# Patient Record
Sex: Male | Born: 1995 | Race: White | Hispanic: No | Marital: Married | Smoking: Never smoker
Health system: Southern US, Community
[De-identification: ages and names within clinical notes are randomized; demographics above are authoritative.]

## PROBLEM LIST (undated history)

## (undated) DIAGNOSIS — L02419 Cutaneous abscess of limb, unspecified: Secondary | ICD-10-CM

## (undated) DIAGNOSIS — E669 Obesity, unspecified: Secondary | ICD-10-CM

## (undated) HISTORY — DX: Cutaneous abscess of limb, unspecified: L02.419

## (undated) HISTORY — DX: Obesity, unspecified: E66.9

## (undated) HISTORY — PX: NO PAST SURGERIES: SHX2092

## (undated) HISTORY — PX: WISDOM TOOTH EXTRACTION: SHX21

---

## 2017-02-03 ENCOUNTER — Ambulatory Visit: Payer: Self-pay | Admitting: Physician Assistant

## 2017-02-18 ENCOUNTER — Encounter: Payer: Self-pay | Admitting: Physician Assistant

## 2017-02-18 ENCOUNTER — Encounter (INDEPENDENT_AMBULATORY_CARE_PROVIDER_SITE_OTHER): Payer: Self-pay

## 2017-02-18 ENCOUNTER — Ambulatory Visit (INDEPENDENT_AMBULATORY_CARE_PROVIDER_SITE_OTHER): Payer: Self-pay | Admitting: Physician Assistant

## 2017-02-18 VITALS — BP 135/82 | HR 96 | Temp 98.1°F | Ht 74.0 in | Wt 273.3 lb

## 2017-02-18 DIAGNOSIS — R1084 Generalized abdominal pain: Secondary | ICD-10-CM

## 2017-02-18 DIAGNOSIS — Z7689 Persons encountering health services in other specified circumstances: Secondary | ICD-10-CM

## 2017-02-18 DIAGNOSIS — R1013 Epigastric pain: Secondary | ICD-10-CM

## 2017-02-18 DIAGNOSIS — F32A Depression, unspecified: Secondary | ICD-10-CM

## 2017-02-18 DIAGNOSIS — E6609 Other obesity due to excess calories: Secondary | ICD-10-CM

## 2017-02-18 DIAGNOSIS — F329 Major depressive disorder, single episode, unspecified: Secondary | ICD-10-CM

## 2017-02-18 DIAGNOSIS — Z131 Encounter for screening for diabetes mellitus: Secondary | ICD-10-CM

## 2017-02-18 DIAGNOSIS — Z6835 Body mass index (BMI) 35.0-35.9, adult: Secondary | ICD-10-CM | POA: Insufficient documentation

## 2017-02-18 DIAGNOSIS — R0982 Postnasal drip: Secondary | ICD-10-CM

## 2017-02-18 MED ORDER — OMEPRAZOLE 20 MG PO CPDR: DELAYED_RELEASE_CAPSULE | ORAL | 11 refills | Status: DC

## 2017-02-18 MED ORDER — FLUTICASONE PROPIONATE 50 MCG/ACT NA SUSP: 2.0000 | Freq: Every day | NASAL | 6 refills | Status: DC

## 2017-02-18 MED ORDER — CETIRIZINE HCL 10 MG PO TABS: 10.0000 mg | ORAL_TABLET | Freq: Every day | ORAL | 11 refills | Status: DC

## 2017-02-18 NOTE — Patient Instructions (Addendum)
For mucus/post-nasal drip - Cetirizine 10 mg nightly (antihistamine) - Flonase 2 sprays in each nostril nightly (intranasal steroid)  For reflux: - GERD diet - 4 week trial of Omeprazole    Food Choices for Gastroesophageal Reflux Disease, Adult When you have gastroesophageal reflux disease (GERD), the foods you eat and your eating habits are very important. Choosing the right foods can help ease the discomfort of GERD. Consider working with a diet and nutrition specialist (dietitian) to help you make healthy food choices. What general guidelines should I follow? Eating plan  Choose healthy foods low in fat, such as fruits, vegetables, whole grains, low-fat dairy products, and lean meat, fish, and poultry.  Eat frequent, small meals instead of three large meals each day. Eat your meals slowly, in a relaxed setting. Avoid bending over or lying down until 2-3 hours after eating.  Limit high-fat foods such as fatty meats or fried foods.  Limit your intake of oils, butter, and shortening to less than 8 teaspoons each day.  Avoid the following: ? Foods that cause symptoms. These may be different for different people. Keep a food diary to keep track of foods that cause symptoms. ? Alcohol. ? Drinking large amounts of liquid with meals. ? Eating meals during the 2-3 hours before bed.  Cook foods using methods other than frying. This may include baking, grilling, or broiling. Lifestyle   Maintain a healthy weight. Ask your health care provider what weight is healthy for you. If you need to lose weight, work with your health care provider to do so safely.  Exercise for at least 30 minutes on 5 or more days each week, or as told by your health care provider.  Avoid wearing clothes that fit tightly around your waist and chest.  Do not use any products that contain nicotine or tobacco, such as cigarettes and e-cigarettes. If you need help quitting, ask your health care provider.  Sleep  with the head of your bed raised. Use a wedge under the mattress or blocks under the bed frame to raise the head of the bed. What foods are not recommended? The items listed may not be a complete list. Talk with your dietitian about what dietary choices are best for you. Grains Pastries or quick breads with added fat. [place] toast. Vegetables Deep fried vegetables. [place] fries. Any vegetables prepared with added fat. Any vegetables that cause symptoms. For some people this may include tomatoes and tomato products, chili peppers, onions and garlic, and horseradish. Fruits Any fruits prepared with added fat. Any fruits that cause symptoms. For some people this may include citrus fruits, such as oranges, grapefruit, pineapple, and lemons. Meats and other protein foods High-fat meats, such as fatty beef or pork, hot dogs, ribs, ham, sausage, salami and bacon. Fried meat or protein, including fried fish and fried chicken. Nuts and nut butters. Dairy Whole milk and chocolate milk. Sour cream. Cream. Ice cream. Cream cheese. Milk shakes. Beverages Coffee and tea, with or without caffeine. Carbonated beverages. Sodas. Energy drinks. Fruit juice made with acidic fruits (such as orange or grapefruit). Tomato juice. Alcoholic drinks. Fats and oils Butter. Margarine. Shortening. Ghee. Sweets and desserts Chocolate and cocoa. Donuts. Seasoning and other foods Pepper. Peppermint and spearmint. Any condiments, herbs, or seasonings that cause symptoms. For some people, this may include curry, hot sauce, or vinegar-based salad dressings. Summary  When you have gastroesophageal reflux disease (GERD), food and lifestyle choices are very important to help ease the discomfort of GERD.  Eat frequent, small meals instead of three large meals each day. Eat your meals slowly, in a relaxed setting. Avoid bending over or lying down until 2-3 hours after eating.  Limit high-fat foods such as fatty meat or fried  foods. This information is not intended to replace advice given to you by your health care provider. Make sure you discuss any questions you have with your health care provider. Document Released: 03/31/2005 Document Revised: 04/01/2016 Document Reviewed: 04/01/2016 Elsevier Interactive Patient Education  2017 [employer]

## 2017-02-18 NOTE — Progress Notes (Signed)
HPI:                                                                Joel Ferguson is a 21 y.o. male who [facility] today to establish care  Current concerns include: abdominal pain, post-nasal drip  Patient reports 1 year of globus sensation, belching, burning epigastric pain, nausea, regurgitation, and throat clearing. Gradually worsening. Symptoms are worse with spicy foods. Denies fever, chills, weight loss, dysphagia, forceful vomiting, hematochezia, melena, change in bowel habits. Has tried Tums with minimal relief. Denies NSAID use.  Patient also reports mucus in his throat, worse in the mornings. He is able to cough it up, but it is bothersome to him. No sore throat, dysphagia, odynophagia. No rhinorrhea, congestion, sinus pressure/pain, headaches.  Past Medical History:  Diagnosis Date  . Abscess of knee    Past Surgical History:  Procedure Laterality Date  . NO PAST SURGERIES     Social History   Tobacco Use  . Smoking status: Never Smoker  . Smokeless tobacco: Never Used  Substance Use Topics  . Alcohol use: Yes    Comment: OCCASSION'S   family history includes Colon polyps in his maternal grandmother.  ROS: Review of Systems  Constitutional: Negative.   HENT: Positive for sore throat.   Eyes: Negative.   Cardiovascular: Positive for chest pain.  Gastrointestinal: Positive for abdominal pain, constipation, diarrhea, heartburn and nausea.  Musculoskeletal: Positive for back pain.  Skin: Negative.   Psychiatric/Behavioral: Positive for depression. The patient is nervous/anxious.     Medications: Current Outpatient Medications  Medication Sig Dispense Refill  . cetirizine (ZYRTEC) 10 MG tablet Take 1 tablet (10 mg total) at bedtime by mouth. 30 tablet 11  . fluticasone (FLONASE) 50 MCG/ACT nasal spray Place 2 sprays at bedtime into both nostrils. 16 g 6  . omeprazole (PRILOSEC) 20 MG capsule Daily x 4  weeks 30 capsule 11   No current facility-administered medications for this visit.    Allergies  Allergen Reactions  . Nickel Itching       Objective:  BP 135/82   Pulse 96   Temp 98.1 F (36.7 C)   Ht 6\' 2"  (1.88 m)   Wt 273 lb 5 oz (124 kg)   SpO2 96%   BMI 35.09 kg/m  Gen:  alert, not ill-appearing, no distress, appropriate for age, obese male HEENT: head normocephalic without obvious abnormality, conjunctiva and cornea clear, oropharynx clear, no exudates, uvula midline, neck supple, no adenopathy, trachea midline Pulm: Normal work of breathing, normal phonation, clear to auscultation bilaterally, no wheezes, rales or rhonchi CV: Normal rate, regular rhythm, s1 and s2 distinct, no murmurs, clicks or rubs  GI: abdomen soft, nontender, non-distended, exam limited by body habitus Neuro: alert and oriented x 3, no tremor MSK: extremities atraumatic, normal gait and station Skin: intact, no rashes on exposed skin, no jaundice, no cyanosis Psych: well-groomed, cooperative, good eye contact, depressed mood, affect mood-congruent, speech is articulate, and thought processes clear and goal-directed  Depression screen [facility] 02/18/2017  Decreased Interest 2  Down, Depressed, Hopeless 2  PHQ - 2 Score 4  Altered sleeping 1  Tired, decreased energy 1  Change in appetite 2  Feeling bad  or failure about yourself  2  Trouble concentrating 0  Moving slowly or fidgety/restless 0  Suicidal thoughts 0  PHQ-9 Score 10    No flowsheet data found.     No results found.    Assessment and Plan: 21 y.o. male with   1. Encounter to establish care - reviewed PMH, PSH, PFH, medications and allergies - reviewed health maintenance - declines influenza  2. Dyspepsia - H. pylori breath test today. 4 week PPI trial - omeprazole (PRILOSEC) 20 MG capsule; Daily x 4 weeks  Dispense: 30 capsule; Refill: 11   3. Generalized abdominal pain - CBC - Comprehensive metabolic panel -  Hemoglobin A1c - Lipase - H. pylori breath test  4. Screening for diabetes mellitus - Hemoglobin A1c  5. Class 2 obesity due to excess calories without serious comorbidity with body mass index (BMI) of 35.0 to 35.9 in adult - counseled on weight loss through decreasing caloric intake and increasing cardiovascular exercise - Hemoglobin A1c  6. Post-nasal drainage - nightly antihistamine and intranasal corticosteroid - fluticasone (FLONASE) 50 MCG/ACT nasal spray; Place 2 sprays at bedtime into both nostrils.  Dispense: 16 g; Refill: 6 - cetirizine (ZYRTEC) 10 MG tablet; Take 1 tablet (10 mg total) at bedtime by mouth.  Dispense: 30 tablet; Refill: 11  7. Minor depression - PHQ9 score 10, no acute safety issues - there was not adequate time to address this problem in detail at this initial visit due to patient's other concerns. He was encouraged to follow-up in 1-2 weeks.   Patient education and anticipatory guidance given Patient agrees with treatment plan Follow-up as needed if symptoms worsen or fail to improve  Levonne Hubertharley E. Jarrett Chicoine PA-C

## 2017-02-19 ENCOUNTER — Encounter: Payer: Self-pay | Admitting: Physician Assistant

## 2017-02-19 LAB — CBC
HCT: 47.2 % (ref 38.5–50.0)
HEMOGLOBIN: 16.1 g/dL (ref 13.2–17.1)
MCH: 28.5 pg (ref 27.0–33.0)
MCHC: 34.1 g/dL (ref 32.0–36.0)
MCV: 83.5 fL (ref 80.0–100.0)
MPV: 11.9 fL (ref 7.5–12.5)
PLATELETS: 251 10*3/uL (ref 140–400)
RBC: 5.65 10*6/uL (ref 4.20–5.80)
RDW: 13.1 % (ref 11.0–15.0)
WBC: 13.1 10*3/uL — AB (ref 3.8–10.8)

## 2017-02-19 LAB — LIPASE: Lipase: 11 U/L (ref 7–60)

## 2017-02-19 LAB — COMPREHENSIVE METABOLIC PANEL
AG RATIO: 1.6 (calc) (ref 1.0–2.5)
ALT: 25 U/L (ref 9–46)
AST: 18 U/L (ref 10–40)
Albumin: 4.5 g/dL (ref 3.6–5.1)
Alkaline phosphatase (APISO): 72 U/L (ref 40–115)
BUN: 14 mg/dL (ref 7–25)
CHLORIDE: 102 mmol/L (ref 98–110)
CO2: 27 mmol/L (ref 20–32)
CREATININE: 0.96 mg/dL (ref 0.60–1.35)
Calcium: 9.5 mg/dL (ref 8.6–10.3)
GLOBULIN: 2.9 g/dL (ref 1.9–3.7)
GLUCOSE: 84 mg/dL (ref 65–99)
Potassium: 4.3 mmol/L (ref 3.5–5.3)
SODIUM: 138 mmol/L (ref 135–146)
TOTAL PROTEIN: 7.4 g/dL (ref 6.1–8.1)
Total Bilirubin: 0.4 mg/dL (ref 0.2–1.2)

## 2017-02-19 LAB — HEMOGLOBIN A1C
EAG (MMOL/L): 5.7 (calc)
Hgb A1c MFr Bld: 5.2 % of total Hgb (ref <5.7)
Mean Plasma Glucose: 103 (calc)

## 2017-02-19 LAB — H. PYLORI BREATH TEST: H. PYLORI BREATH TEST: NOT DETECTED

## 2017-03-19 ENCOUNTER — Ambulatory Visit: Payer: Self-pay | Admitting: Physician Assistant

## 2017-09-23 ENCOUNTER — Encounter: Payer: Self-pay | Admitting: Physician Assistant

## 2017-09-23 ENCOUNTER — Ambulatory Visit (INDEPENDENT_AMBULATORY_CARE_PROVIDER_SITE_OTHER): Payer: Self-pay | Admitting: Physician Assistant

## 2017-09-23 VITALS — BP 121/83 | HR 86 | Temp 98.1°F | Wt 284.0 lb

## 2017-09-23 DIAGNOSIS — R152 Fecal urgency: Secondary | ICD-10-CM

## 2017-09-23 DIAGNOSIS — R072 Precordial pain: Secondary | ICD-10-CM

## 2017-09-23 DIAGNOSIS — H6992 Unspecified Eustachian tube disorder, left ear: Secondary | ICD-10-CM | POA: Insufficient documentation

## 2017-09-23 DIAGNOSIS — R195 Other fecal abnormalities: Secondary | ICD-10-CM

## 2017-09-23 DIAGNOSIS — K219 Gastro-esophageal reflux disease without esophagitis: Secondary | ICD-10-CM | POA: Insufficient documentation

## 2017-09-23 DIAGNOSIS — Z9109 Other allergy status, other than to drugs and biological substances: Secondary | ICD-10-CM

## 2017-09-23 DIAGNOSIS — H6982 Other specified disorders of Eustachian tube, left ear: Secondary | ICD-10-CM

## 2017-09-23 MED ORDER — SUCRALFATE 1 G PO TABS: 1.0000 g | ORAL_TABLET | Freq: Three times a day (TID) | ORAL | 1 refills | Status: DC

## 2017-09-23 MED ORDER — ESOMEPRAZOLE MAGNESIUM 40 MG PO CPDR: 40.0000 mg | DELAYED_RELEASE_CAPSULE | Freq: Every day | ORAL | 1 refills | Status: DC

## 2017-09-23 NOTE — Progress Notes (Signed)
HPI:                                                                Joel Ferguson is a 22 y.o. male who presents to [facility]: Primary Care Sports Medicine today for multiple concerns  Episodic left-sided chest pain, described as sharp, lasting minutes, described as annoying, occuring 2-3 times per month. Only occurs at rest. In the last month, reports he was a passenger in car and felt a crushing left-sided chest pain described as severe lasting 10 minutes. It was not associated with any palpitations, irregular heart beats, diaphoresis. He has never had exertional chest pain. Reports dyspnea on exertion on a recent hike, but currently does not do any form of regular aerobic activity.   Abdominal Pain  This is a chronic problem. The current episode started more than 1 year ago. The problem has been unchanged. The pain is located in the LUQ and epigastric region. Associated symptoms include diarrhea (loose stools), nausea and vomiting (regurgitation and emesis weekly). Pertinent negatives include no hematochezia or weight loss. Associated symptoms comments: + early satiety + fecal urgency. He has tried proton pump inhibitors for the symptoms. The treatment provided no relief.   Rhinitis: still having nasal congestion, post-nasal drip, and mucus. Reports when he was taking Zyrtec and Flonase it was helpful, but was drying to the back of his throat. He recently developed left ear fullness and occasional popping.  He would like to be tested for allergies.   No flowsheet data found.    Past Medical History:  Diagnosis Date  . Abscess of knee   . Obesity    Past Surgical History:  Procedure Laterality Date  . NO PAST SURGERIES     Social History   Tobacco Use  . Smoking status: Never Smoker  . Smokeless tobacco: Never Used  Substance Use Topics  . Alcohol use: Yes    Comment: OCCASSION'S   family history includes Colon polyps in his maternal grandmother; Edema in  his paternal grandmother; Gallstones in his mother; Pancreatic cancer in his other.    ROS: Review of Systems  Constitutional: Negative for weight loss.  HENT:       + L ear fullness and popping  Cardiovascular: Positive for chest pain.  Gastrointestinal: Positive for abdominal pain, diarrhea (loose stools), nausea and vomiting (regurgitation and emesis weekly). Negative for hematochezia.  Musculoskeletal: Positive for back pain.  Endo/Heme/Allergies: Positive for environmental allergies.    Medications: Current Outpatient Medications  Medication Sig Dispense Refill  . cetirizine (ZYRTEC) 10 MG tablet Take 1 tablet (10 mg total) at bedtime by mouth. 30 tablet 11  . esomeprazole (NEXIUM) 40 MG capsule Take 1 capsule (40 mg total) by mouth daily before breakfast. 30 capsule 1  . sucralfate (CARAFATE) 1 g tablet Take 1 tablet (1 g total) by mouth 4 (four) times daily -  with meals and at bedtime. 120 tablet 1   No current facility-administered medications for this visit.    Allergies  Allergen Reactions  . Nickel Itching       Objective:  BP 121/83   Pulse 86   Temp 98.1 F (36.7 C) (Oral)   Wt 284 lb (128.8 kg)   BMI 36.46 kg/m  Gen:  alert, not ill-appearing, no distress, appropriate for age, obese male  HEENT: head normocephalic without obvious abnormality, conjunctiva and cornea clear, TM's pearly gray and semi-transparent, normal external ear canals bilaterally, trachea midline Pulm: Normal work of breathing, normal phonation, clear to auscultation bilaterally, no wheezes, rales or rhonchi CV: Normal rate, regular rhythm, s1 and s2 distinct, no murmurs, clicks or rubs  GI: abdomen soft, there is epigastric and LUQ tenderness, no guarding or rigidity, no palpable masses Neuro: alert and oriented x 3, no tremor MSK: extremities atraumatic, normal gait and station, no peripheral edema Skin: intact, no rashes on exposed skin, no jaundice, no cyanosis Psych: well-groomed,  cooperative, good eye contact, euthymic mood, affect mood-congruent, speech is articulate, and thought processes clear and goal-directed    No results found for this or any previous visit (from the past 72 hour(s)). No results found.    Assessment and Plan: 22 y.o. male with   Gastroesophageal reflux disease, esophagitis presence not specified - Plan: Ambulatory referral to Gastroenterology, sucralfate (CARAFATE) 1 g tablet, esomeprazole (NEXIUM) 40 MG capsule  Precordial chest pain  Loose stools  Fecal urgency  Environmental allergies - Plan: Ambulatory referral to Allergy  Eustachian tube dysfunction, left  GERD - failed trial of low-dose PPI and dietary modification. Work-up 7 months ago was unremarkable - normal CBC, CMP, lipase and H. Pylori breath test. Symptoms are worsening with early satiety and nausea present most days. Referring to GI for EGD - will switch to high-dose Nexium and Carafate in the meantime - encouraged to eat smaller, frequent meals and avoid fried and overly processed food  Precordial chest pain - non-exertional, localized, sporadic left-sided chest pain. Most likely precordial catch syndrome - patient deferred cardiac work-up today  Loose stools, fecal urgency - likely IBS-D - referring to GI  Eustachian tube dysfunction - no evidence of acute OM on exam - encouraged him to re-start his antihistamine and Flonase. If he is having dryness, he can reduce Flonase to QOD or 2-3 times per week   I spent 40 minutes with this patient, greater than 50% was face-to-face time counseling regarding the above diagnoses   Patient education and anticipatory guidance given Patient agrees with treatment plan Follow-up as needed if symptoms worsen or fail to improve  Levonne Hubertharley E. Cummings PA-C

## 2017-09-23 NOTE — Patient Instructions (Addendum)
Nasal Allergies Nasal allergies are a reaction to allergens in the air. Allergens are particles in the air that cause your body to have an allergic reaction. Nasal allergies are not passed from person to person (are not contagious). They cannot be cured, but they can be controlled. What are the causes? Seasonal nasal allergies (hay fever) are caused by pollen allergens that come from grasses, trees, and weeds. Year-round nasal allergies (perennial allergic rhinitis) are caused by allergens such as house dust mites, pet dander, and mold spores. What increases the risk? The following factors may make you more likely to develop this condition:  Having certain health conditions. These include: ? Other types of allergies, such as food allergies. ? Asthma. ? Eczema.  Having a close relative who has allergies or asthma.  Exposure to house dust, pollen, dander, or other allergens at home or at work.  Exposure to air pollution or secondhand smoke when you were a child.  What are the signs or symptoms? Symptoms of this condition include:  Sneezing.  Runny nose or stuffy nose (congestion).  Watery (tearing) eyes.  Itchy eyes, nose, mouth, throat, skin, or other area.  Sore throat.  Headache.  Decreased sense of smell or taste.  Fatigue. This may occur if you have trouble sleeping due to allergies.  Swollen eyelids.  How is this diagnosed? This condition is diagnosed with a medical history and physical exam. Allergy testing may be done to determine exactly what triggers your nasal allergies. How is this treated? There is no cure for nasal allergies. Treatment focuses on controlling your symptoms, and it may include:  Medicines that block allergy symptoms. These may include allergy shots, nasal sprays, and oral antihistamines.  Avoiding the allergen.  Follow these instructions at home:  Avoid the allergen that is causing your symptoms, if possible.  Keep windows closed. If  possible, use air conditioning when pollen counts are high.  Do not use fans in your home.  Do not hang clothes outside to dry.  Wear sunglasses to keep pollen out of your eyes.  Wash your hands right away after you touch household pets.  Take over-the-counter and prescription medicines only as told by your health care provider.  Keep all follow-up visits as told by your health care provider. This is important. Contact a health care provider if:  You have a fever.  You develop a cough that does not go away (is persistent).  You start to wheeze.  Your symptoms do not improve with treatment.  You have thick nasal discharge.  You start to have nosebleeds. Get help right away if:  Your tongue or your lips are swollen.  You have trouble breathing.  You feel light-headed or you feel like you are going to faint.  You have cold sweats. This information is not intended to replace advice given to you by your health care provider. Make sure you discuss any questions you have with your health care provider. Document Released: 03/31/2005 Document Revised: 09/03/2015 Document Reviewed: 10/11/2014 Elsevier Interactive Patient Education  2018 [employer].   Food Choices for Gastroesophageal Reflux Disease, Adult When you have gastroesophageal reflux disease (GERD), the foods you eat and your eating habits are very important. Choosing the right foods can help ease the discomfort of GERD. Consider working with a diet and nutrition specialist (dietitian) to help you make healthy food choices. What general guidelines should I follow? Eating plan  Choose healthy foods low in fat, such as fruits, vegetables, whole grains, low-fat  dairy products, and lean meat, fish, and poultry.  Eat frequent, small meals instead of three large meals each day. Eat your meals slowly, in a relaxed setting. Avoid bending over or lying down until 2-3 hours after eating.  Limit high-fat foods such as fatty  meats or fried foods.  Limit your intake of oils, butter, and shortening to less than 8 teaspoons each day.  Avoid the following: ? Foods that cause symptoms. These may be different for different people. Keep a food diary to keep track of foods that cause symptoms. ? Alcohol. ? Drinking large amounts of liquid with meals. ? Eating meals during the 2-3 hours before bed.  Cook foods using methods other than frying. This may include baking, grilling, or broiling. Lifestyle   Maintain a healthy weight. Ask your health care provider what weight is healthy for you. If you need to lose weight, work with your health care provider to do so safely.  Exercise for at least 30 minutes on 5 or more days each week, or as told by your health care provider.  Avoid wearing clothes that fit tightly around your waist and chest.  Do not use any products that contain nicotine or tobacco, such as cigarettes and e-cigarettes. If you need help quitting, ask your health care provider.  Sleep with the head of your bed raised. Use a wedge under the mattress or blocks under the bed frame to raise the head of the bed. What foods are not recommended? The items listed may not be a complete list. Talk with your dietitian about what dietary choices are best for you. Grains Pastries or quick breads with added fat. [place] toast. Vegetables Deep fried vegetables. [place] fries. Any vegetables prepared with added fat. Any vegetables that cause symptoms. For some people this may include tomatoes and tomato products, chili peppers, onions and garlic, and horseradish. Fruits Any fruits prepared with added fat. Any fruits that cause symptoms. For some people this may include citrus fruits, such as oranges, grapefruit, pineapple, and lemons. Meats and other protein foods High-fat meats, such as fatty beef or pork, hot dogs, ribs, ham, sausage, salami and bacon. Fried meat or protein, including fried fish and fried chicken. Nuts  and nut butters. Dairy Whole milk and chocolate milk. Sour cream. Cream. Ice cream. Cream cheese. Milk shakes. Beverages Coffee and tea, with or without caffeine. Carbonated beverages. Sodas. Energy drinks. Fruit juice made with acidic fruits (such as orange or grapefruit). Tomato juice. Alcoholic drinks. Fats and oils Butter. Margarine. Shortening. Ghee. Sweets and desserts Chocolate and cocoa. Donuts. Seasoning and other foods Pepper. Peppermint and spearmint. Any condiments, herbs, or seasonings that cause symptoms. For some people, this may include curry, hot sauce, or vinegar-based salad dressings. Summary  When you have gastroesophageal reflux disease (GERD), food and lifestyle choices are very important to help ease the discomfort of GERD.  Eat frequent, small meals instead of three large meals each day. Eat your meals slowly, in a relaxed setting. Avoid bending over or lying down until 2-3 hours after eating.  Limit high-fat foods such as fatty meat or fried foods. This information is not intended to replace advice given to you by your health care provider. Make sure you discuss any questions you have with your health care provider. Document Released: 03/31/2005 Document Revised: 04/01/2016 Document Reviewed: 04/01/2016 Elsevier Interactive Patient Education  [employer]2018 Else[employer] Inc.

## 2017-11-23 ENCOUNTER — Encounter: Payer: Self-pay | Admitting: Physician Assistant

## 2019-11-08 ENCOUNTER — Ambulatory Visit (INDEPENDENT_AMBULATORY_CARE_PROVIDER_SITE_OTHER): Payer: 59 | Admitting: Family Medicine

## 2019-11-08 ENCOUNTER — Encounter: Payer: Self-pay | Admitting: Family Medicine

## 2019-11-08 DIAGNOSIS — J209 Acute bronchitis, unspecified: Secondary | ICD-10-CM | POA: Diagnosis not present

## 2019-11-08 MED ORDER — DOXYCYCLINE HYCLATE 100 MG PO TABS: 100.0000 mg | ORAL_TABLET | Freq: Two times a day (BID) | ORAL | 0 refills | Status: DC

## 2019-11-08 MED ORDER — PREDNISONE 20 MG PO TABS: 20.0000 mg | ORAL_TABLET | Freq: Two times a day (BID) | ORAL | 0 refills | Status: AC

## 2019-11-08 MED ORDER — ALBUTEROL SULFATE HFA 108 (90 BASE) MCG/ACT IN AERS: 2.0000 | INHALATION_SPRAY | Freq: Four times a day (QID) | RESPIRATORY_TRACT | 1 refills | Status: AC | PRN

## 2019-11-08 NOTE — Progress Notes (Signed)
Joel Ferguson - 24 y.o. male MRN [id number]  Date of birth: [birth year]  Subjective Chief Complaint  Patient presents with  . Cough  . Wheezing    HPI Joel Ferguson is a 24 y.o. male here today with complaint of cough with chest congestion and wheezing.  He reports that symptoms started about 4 weeks ago.  Family members have had similar symptoms as well. Other family members tested for COVID were negative.  He was feeling better a couple of weeks ago and then symptoms seem to worsen again.  He denies prior history of asthma.  He has not had fever, chills, chest pain, shortness of breath or GI symptoms.    ROS:  A comprehensive ROS was completed and negative except as noted per HPI  Allergies  Allergen Reactions  . Nickel Itching    Past Medical History:  Diagnosis Date  . Abscess of knee   . Obesity     Past Surgical History:  Procedure Laterality Date  . WISDOM TOOTH EXTRACTION      Social History   Socioeconomic History  . Marital status: Married    Spouse name: Not on file  . Number of children: 1  . Years of education: Not on file  . Highest education level: Not on file  Occupational History  . Not on file  Tobacco Use  . Smoking status: Never Smoker  . Smokeless tobacco: Never Used  Vaping Use  . Vaping Use: Never used  Substance and Sexual Activity  . Alcohol use: Yes    Comment: OCCASSION'S  . Drug use: No  . Sexual activity: Not Currently    Birth control/protection: None  Other Topics Concern  . Not on file  Social History Narrative  . Not on file   Social Determinants of Health   Financial Resource Strain:   . Difficulty of Paying Living Expenses:   Food Insecurity:   . Worried About Programme researcher, broadcasting/film/video in the Last Year:   . Barista in the Last Year:   Transportation Needs:   . Freight forwarder (Medical):   Marland Kitchen Lack of Transportation (Non-Medical):   Physical Activity:   . Days of Exercise per Week:   . Minutes of Exercise per  Session:   Stress:   . Feeling of Stress :   Social Connections:   . Frequency of Communication with Friends and Family:   . Frequency of Social Gatherings with Friends and Family:   . Attends Religious Services:   . Active Member of Clubs or Organizations:   . Attends Banker Meetings:   Marland Kitchen Marital Status:     Family History  Problem Relation Age of Onset  . Gallstones Mother   . Colon polyps Maternal Grandmother   . Edema Paternal Grandmother   . Pancreatic cancer Other     Health Maintenance  Topic Date Due  . Hepatitis C Screening  Never done  . COVID-19 Vaccine (1) Never done  . HIV Screening  Never done  . TETANUS/TDAP  Never done  . INFLUENZA VACCINE  11/13/2019     ----------------------------------------------------------------------------------------------------------------------------------------------------------------------------------------------------------------- Physical Exam BP (!) 135/81 (BP Location: Left Arm, Patient Position: Sitting, Cuff Size: Large)   Pulse 103   Temp 98.5 F (36.9 C) (Oral)   Ht 6' 2.02" (1.88 m)   Wt (!) 286 lb (129.7 kg)   SpO2 99%   BMI 36.70 kg/m   Physical Exam Constitutional:      Appearance: Normal appearance.  HENT:     Head: Normocephalic.  Eyes:     General: No scleral icterus. Cardiovascular:     Rate and Rhythm: Normal rate and regular rhythm.  Pulmonary:     Effort: Pulmonary effort is normal.     Breath sounds: Wheezing (mild bilateral expiratory wheezes) present.  Musculoskeletal:     Cervical back: Neck supple.  Neurological:     General: No focal deficit present.     Mental Status: He is alert.  Psychiatric:        Mood and Affect: Mood normal.        Behavior: Behavior normal.      ------------------------------------------------------------------------------------------------------------------------------------------------------------------------------------------------------------------- Assessment and Plan  Acute bronchitis He has had prolonged symptoms x4 weeks as well as second sickening.  Will start doxycycline 100mg  bid Prednisone burst at 20mg  BID x5 days  Albuterol inhaler as needed.  Instructed on use.  Instructed to follow up if symptoms fail to improve or if he develops new or worseningy symptoms.    Meds ordered this encounter  Medications  . doxycycline (VIBRA-TABS) 100 MG tablet    Sig: Take 1 tablet (100 mg total) by mouth 2 (two) times daily.    Dispense:  20 tablet    Refill:  0  . predniSONE (DELTASONE) 20 MG tablet    Sig: Take 1 tablet (20 mg total) by mouth 2 (two) times daily with a meal for 5 days.    Dispense:  10 tablet    Refill:  0  . albuterol (VENTOLIN HFA) 108 (90 Base) MCG/ACT inhaler    Sig: Inhale 2 puffs into the lungs every 6 (six) hours as needed for wheezing or shortness of breath.    Dispense:  18 g    Refill:  1    No follow-ups on file.    This visit occurred during the SARS-CoV-2 public health emergency.  Safety protocols were in place, including screening questions prior to the visit, additional usage of staff PPE, and extensive cleaning of exam room while observing appropriate contact time as indicated for disinfecting solutions.

## 2019-11-08 NOTE — Assessment & Plan Note (Signed)
He has had prolonged symptoms x4 weeks as well as second sickening.  Will start doxycycline 100mg  bid Prednisone burst at 20mg  BID x5 days  Albuterol inhaler as needed.  Instructed on use.  Instructed to follow up if symptoms fail to improve or if he develops new or worseningy symptoms.

## 2019-11-08 NOTE — Patient Instructions (Signed)

## 2019-11-14 ENCOUNTER — Ambulatory Visit: Payer: Self-pay | Admitting: Family Medicine

## 2019-11-14 ENCOUNTER — Encounter: Payer: 59 | Admitting: Family Medicine

## 2019-11-15 ENCOUNTER — Ambulatory Visit (INDEPENDENT_AMBULATORY_CARE_PROVIDER_SITE_OTHER): Payer: 59

## 2019-11-15 ENCOUNTER — Other Ambulatory Visit: Payer: Self-pay

## 2019-11-15 ENCOUNTER — Encounter: Payer: Self-pay | Admitting: Family Medicine

## 2019-11-15 ENCOUNTER — Ambulatory Visit (INDEPENDENT_AMBULATORY_CARE_PROVIDER_SITE_OTHER): Payer: 59 | Admitting: Family Medicine

## 2019-11-15 VITALS — BP 127/84 | HR 93 | Ht 74.02 in | Wt 286.0 lb

## 2019-11-15 DIAGNOSIS — M549 Dorsalgia, unspecified: Secondary | ICD-10-CM | POA: Diagnosis not present

## 2019-11-15 DIAGNOSIS — Z13220 Encounter for screening for lipoid disorders: Secondary | ICD-10-CM | POA: Diagnosis not present

## 2019-11-15 DIAGNOSIS — R21 Rash and other nonspecific skin eruption: Secondary | ICD-10-CM | POA: Diagnosis not present

## 2019-11-15 DIAGNOSIS — Z0000 Encounter for general adult medical examination without abnormal findings: Secondary | ICD-10-CM | POA: Insufficient documentation

## 2019-11-15 MED ORDER — NYSTATIN 100000 UNIT/GM EX POWD: 1.0000 "application " | Freq: Three times a day (TID) | CUTANEOUS | 1 refills | Status: DC

## 2019-11-15 NOTE — Assessment & Plan Note (Signed)
Well adult Orders Placed This Encounter  Procedures  . DG Thoracic Spine W/Swimmers    Standing Status:   Future    Number of Occurrences:   1    Standing Expiration Date:   11/14/2020    Order Specific Question:   Reason for Exam (SYMPTOM  OR DIAGNOSIS REQUIRED)    Answer:   mid back pain    Order Specific Question:   Preferred imaging location?    [facility]    Order Specific Question:   Radiology Contrast Protocol - do NOT remove file path    Answer:   \\[email or web address]  . COMPLETE METABOLIC PANEL WITH GFR  . CBC  . Lipid Profile  Screening: Lipid panel Immunizations: UTD Anticipatory guidance/Risk factor reduction:  Recommend increased exercise with healthy diet.  Additional recommendations per AVS

## 2019-11-15 NOTE — Patient Instructions (Signed)
Preventive Care 19-24 Years Old, Male Preventive care refers to lifestyle choices and visits with your health care provider that can promote health and wellness. This includes:  A yearly physical exam. This is also called an annual well check.  Regular dental and eye exams.  Immunizations.  Screening for certain conditions.  Healthy lifestyle choices, such as eating a healthy diet, getting regular exercise, not using drugs or products that contain nicotine and tobacco, and limiting alcohol use. What can I expect for my preventive care visit? Physical exam Your health care provider will check:  Height and weight. These may be used to calculate body mass index (BMI), which is a measurement that tells if you are at a healthy weight.  Heart rate and blood pressure.  Your skin for abnormal spots. Counseling Your health care provider may ask you questions about:  Alcohol, tobacco, and drug use.  Emotional well-being.  Home and relationship well-being.  Sexual activity.  Eating habits.  Work and work Statistician. What immunizations do I need?  Influenza (flu) vaccine  This is recommended every year. Tetanus, diphtheria, and pertussis (Tdap) vaccine  You may need a Td booster every 10 years. Varicella (chickenpox) vaccine  You may need this vaccine if you have not already been vaccinated. Human papillomavirus (HPV) vaccine  If recommended by your health care provider, you may need three doses over 6 months. Measles, mumps, and rubella (MMR) vaccine  You may need at least one dose of MMR. You may also need a second dose. Meningococcal conjugate (MenACWY) vaccine  One dose is recommended if you are 45-76 years old and a Market researcher living in a residence hall, or if you have one of several medical conditions. You may also need additional booster doses. Pneumococcal conjugate (PCV13) vaccine  You may need this if you have certain conditions and were not  previously vaccinated. Pneumococcal polysaccharide (PPSV23) vaccine  You may need one or two doses if you smoke cigarettes or if you have certain conditions. Hepatitis A vaccine  You may need this if you have certain conditions or if you travel or work in places where you may be exposed to hepatitis A. Hepatitis B vaccine  You may need this if you have certain conditions or if you travel or work in places where you may be exposed to hepatitis B. Haemophilus influenzae type b (Hib) vaccine  You may need this if you have certain risk factors. You may receive vaccines as individual doses or as more than one vaccine together in one shot (combination vaccines). Talk with your health care provider about the risks and benefits of combination vaccines. What tests do I need? Blood tests  Lipid and cholesterol levels. These may be checked every 5 years starting at age 17.  Hepatitis C test.  Hepatitis B test. Screening   Diabetes screening. This is done by checking your blood sugar (glucose) after you have not eaten for a while (fasting).  Sexually transmitted disease (STD) testing. Talk with your health care provider about your test results, treatment options, and if necessary, the need for more tests. Follow these instructions at home: Eating and drinking   Eat a diet that includes fresh fruits and vegetables, whole grains, lean protein, and low-fat dairy products.  Take vitamin and mineral supplements as recommended by your health care provider.  Do not drink alcohol if your health care provider tells you not to drink.  If you drink alcohol: ? Limit how much you have to 0-2  drinks a day. ? Be aware of how much alcohol is in your drink. In the [place] one drink equals one 12 oz bottle of beer (355 mL), one 5 oz glass of wine (148 mL), or one 1 oz glass of hard liquor (44 mL). Lifestyle  Take daily care of your teeth and gums.  Stay active. Exercise for at least 30 minutes on 5 or  more days each week.  Do not use any products that contain nicotine or tobacco, such as cigarettes, e-cigarettes, and chewing tobacco. If you need help quitting, ask your health care provider.  If you are sexually active, practice safe sex. Use a condom or other form of protection to prevent STIs (sexually transmitted infections). What's next?  Go to your health care provider once a year for a well check visit.  Ask your health care provider how often you should have your eyes and teeth checked.  Stay up to date on all vaccines. This information is not intended to replace advice given to you by your health care provider. Make sure you discuss any questions you have with your health care provider. Document Revised: 03/25/2018 Document Reviewed: 03/25/2018 Elsevier Patient Education  2020 [employer].

## 2019-11-15 NOTE — Assessment & Plan Note (Signed)
Likely recurrent spasm.  Given recurrence will obtain imaging today.

## 2019-11-15 NOTE — Assessment & Plan Note (Signed)
Will have him try nystatin powder to groin area.  Instructed to let me know if not improving.

## 2019-11-15 NOTE — Progress Notes (Signed)
Joel Ferguson - 24 y.o. male MRN [id number]  Date of birth: March 15, 1996  Subjective Chief Complaint  Patient presents with  . Annual Exam    HPI Joel Ferguson is a 24 y.o. male here today for annual exam.  He is feeling much better since last visit.    He has had some recurrent episodes of mid back pain between the shoulder blades.  First started about 3 years ago.  No radiation, numbness or tingling.  He has also had irritated rash in groin area.  Tried OTC cream but didn't have much improvement with this.    He is a non-smoker. He consume EtOH occasionally.   Review of Systems  Constitutional: Negative for chills, fever, malaise/fatigue and weight loss.  HENT: Negative for congestion, ear pain and sore throat.   Eyes: Negative for blurred vision, double vision and pain.  Respiratory: Negative for cough and shortness of breath.   Cardiovascular: Negative for chest pain and palpitations.  Gastrointestinal: Negative for abdominal pain, blood in stool, constipation, heartburn and nausea.  Genitourinary: Negative for dysuria and urgency.  Musculoskeletal: Negative for joint pain and myalgias.  Skin: Positive for rash (groin).  Neurological: Negative for dizziness and headaches.  Endo/Heme/Allergies: Does not bruise/bleed easily.  Psychiatric/Behavioral: Negative for depression. The patient is not nervous/anxious and does not have insomnia.       Allergies  Allergen Reactions  . Nickel Itching    Past Medical History:  Diagnosis Date  . Abscess of knee   . Obesity     Past Surgical History:  Procedure Laterality Date  . WISDOM TOOTH EXTRACTION      Social History   Socioeconomic History  . Marital status: Married    Spouse name: Not on file  . Number of children: 1  . Years of education: Not on file  . Highest education level: Not on file  Occupational History  . Not on file  Tobacco Use  . Smoking status: Never Smoker  . Smokeless tobacco: Never Used  Vaping  Use  . Vaping Use: Never used  Substance and Sexual Activity  . Alcohol use: Yes    Comment: OCCASSION'S  . Drug use: No  . Sexual activity: Not Currently    Birth control/protection: None  Other Topics Concern  . Not on file  Social History Narrative  . Not on file   Social Determinants of Health   Financial Resource Strain:   . Difficulty of Paying Living Expenses:   Food Insecurity:   . Worried About Programme researcher, broadcasting/film/video in the Last Year:   . Barista in the Last Year:   Transportation Needs:   . Freight forwarder (Medical):   Marland Kitchen Lack of Transportation (Non-Medical):   Physical Activity:   . Days of Exercise per Week:   . Minutes of Exercise per Session:   Stress:   . Feeling of Stress :   Social Connections:   . Frequency of Communication with Friends and Family:   . Frequency of Social Gatherings with Friends and Family:   . Attends Religious Services:   . Active Member of Clubs or Organizations:   . Attends Banker Meetings:   Marland Kitchen Marital Status:     Family History  Problem Relation Age of Onset  . Gallstones Mother   . Colon polyps Maternal Grandmother   . Edema Paternal Grandmother   . Pancreatic cancer Other     Health Maintenance  Topic Date Due  .  Hepatitis C Screening  Never done  . COVID-19 Vaccine (1) Never done  . HIV Screening  Never done  . TETANUS/TDAP  Never done  . INFLUENZA VACCINE  11/13/2019     ----------------------------------------------------------------------------------------------------------------------------------------------------------------------------------------------------------------- Physical Exam BP 127/84 (BP Location: Left Arm, Patient Position: Sitting, Cuff Size: Large)   Pulse 93   Ht 6' 2.02" (1.88 m)   Wt 286 lb (129.7 kg)   SpO2 96%   BMI 36.70 kg/m   Physical Exam Constitutional:      General: He is not in acute distress. HENT:     Head: Normocephalic and atraumatic.     Right  Ear: External ear normal.     Left Ear: External ear normal.  Eyes:     General: No scleral icterus. Neck:     Thyroid: No thyromegaly.  Cardiovascular:     Rate and Rhythm: Normal rate and regular rhythm.     Heart sounds: Normal heart sounds.  Pulmonary:     Effort: Pulmonary effort is normal.     Breath sounds: Normal breath sounds.  Abdominal:     General: Bowel sounds are normal. There is no distension.     Palpations: Abdomen is soft.     Tenderness: There is no abdominal tenderness. There is no guarding.  Musculoskeletal:     Cervical back: Normal range of motion.  Lymphadenopathy:     Cervical: No cervical adenopathy.  Skin:    General: Skin is warm and dry.     Findings: No rash.  Neurological:     Mental Status: He is alert and oriented to person, place, and time.     Cranial Nerves: No cranial nerve deficit.     Motor: No abnormal muscle tone.  Psychiatric:        Behavior: Behavior normal.     ------------------------------------------------------------------------------------------------------------------------------------------------------------------------------------------------------------------- Assessment and Plan  Mid back pain Likely recurrent spasm.  Given recurrence will obtain imaging today.  Well adult exam Well adult Orders Placed This Encounter  Procedures  . DG Thoracic Spine W/Swimmers    Standing Status:   Future    Number of Occurrences:   1    Standing Expiration Date:   11/14/2020    Order Specific Question:   Reason for Exam (SYMPTOM  OR DIAGNOSIS REQUIRED)    Answer:   mid back pain    Order Specific Question:   Preferred imaging location?    Answer[facility]    Order Specific Question:   Radiology Contrast Protocol - do NOT remove file path    Answer:   \\[email or web address]\DXFluoroContrastProtocols.pdf  . COMPLETE METABOLIC PANEL WITH GFR  . CBC  . Lipid Profile  Screening: Lipid panel Immunizations:  UTD Anticipatory guidance/Risk factor reduction:  Recommend increased exercise with healthy diet.  Additional recommendations per AVS  Rash Will have him try nystatin powder to groin area.  Instructed to let me know if not improving.     Meds ordered this encounter  Medications  . nystatin (MYCOSTATIN/NYSTOP) powder    Sig: Apply 1 application topically 3 (three) times daily.    Dispense:  60 g    Refill:  1    No follow-ups on file.    This visit occurred during the SARS-CoV-2 public health emergency.  Safety protocols were in place, including screening questions prior to the visit, additional usage of staff PPE, and extensive cleaning of exam room while observing appropriate contact time as indicated for disinfecting solutions.

## 2019-11-18 ENCOUNTER — Encounter: Payer: 59 | Admitting: Family Medicine

## 2020-01-03 ENCOUNTER — Other Ambulatory Visit: Payer: Self-pay | Admitting: Family Medicine

## 2020-01-03 ENCOUNTER — Telehealth: Payer: Self-pay

## 2020-01-03 DIAGNOSIS — Z3009 Encounter for other general counseling and advice on contraception: Secondary | ICD-10-CM

## 2020-01-03 NOTE — Telephone Encounter (Signed)
Orders entered

## 2020-01-03 NOTE — Telephone Encounter (Signed)
Pt's spouse, Abigail, lvm requesting referral be ordered for [facility] to have a vasectomy.  Spoke to [place] directly. She states that due to prolonged menstrual cycles she has an appt scheduled to have her IUD removed and possibly start birth control.  However, they would like for [facility] to explore having a vasectomy as well.   Dr. Ashley Royalty was made aware and agrees to sending referral to Urology for vasectomy.

## 2020-01-23 ENCOUNTER — Other Ambulatory Visit: Payer: Self-pay | Admitting: Family Medicine

## 2020-01-23 ENCOUNTER — Telehealth: Payer: Self-pay

## 2020-01-23 DIAGNOSIS — F411 Generalized anxiety disorder: Secondary | ICD-10-CM

## 2020-01-23 NOTE — Telephone Encounter (Signed)
Referral entered to psychology.

## 2020-01-23 NOTE — Telephone Encounter (Signed)
Request for a general therapist within [facility].

## 2020-02-06 ENCOUNTER — Ambulatory Visit (INDEPENDENT_AMBULATORY_CARE_PROVIDER_SITE_OTHER): Payer: 59 | Admitting: Professional

## 2020-02-06 DIAGNOSIS — F411 Generalized anxiety disorder: Secondary | ICD-10-CM | POA: Diagnosis not present

## 2020-02-13 ENCOUNTER — Ambulatory Visit (INDEPENDENT_AMBULATORY_CARE_PROVIDER_SITE_OTHER): Payer: 59 | Admitting: Professional

## 2020-02-13 DIAGNOSIS — F411 Generalized anxiety disorder: Secondary | ICD-10-CM | POA: Diagnosis not present

## 2020-02-20 ENCOUNTER — Ambulatory Visit (INDEPENDENT_AMBULATORY_CARE_PROVIDER_SITE_OTHER): Payer: 59 | Admitting: Professional

## 2020-02-20 DIAGNOSIS — F411 Generalized anxiety disorder: Secondary | ICD-10-CM

## 2020-02-29 ENCOUNTER — Ambulatory Visit (INDEPENDENT_AMBULATORY_CARE_PROVIDER_SITE_OTHER): Payer: 59 | Admitting: Professional

## 2020-02-29 DIAGNOSIS — F411 Generalized anxiety disorder: Secondary | ICD-10-CM

## 2020-03-05 ENCOUNTER — Ambulatory Visit (INDEPENDENT_AMBULATORY_CARE_PROVIDER_SITE_OTHER): Payer: 59 | Admitting: Professional

## 2020-03-05 DIAGNOSIS — F411 Generalized anxiety disorder: Secondary | ICD-10-CM | POA: Diagnosis not present

## 2020-03-13 ENCOUNTER — Ambulatory Visit (INDEPENDENT_AMBULATORY_CARE_PROVIDER_SITE_OTHER): Payer: 59 | Admitting: Professional

## 2020-03-13 DIAGNOSIS — F411 Generalized anxiety disorder: Secondary | ICD-10-CM | POA: Diagnosis not present

## 2020-03-19 ENCOUNTER — Ambulatory Visit (INDEPENDENT_AMBULATORY_CARE_PROVIDER_SITE_OTHER): Payer: 59 | Admitting: Professional

## 2020-03-19 DIAGNOSIS — F411 Generalized anxiety disorder: Secondary | ICD-10-CM

## 2020-03-20 ENCOUNTER — Encounter: Payer: Self-pay | Admitting: Family Medicine

## 2020-03-20 ENCOUNTER — Telehealth (INDEPENDENT_AMBULATORY_CARE_PROVIDER_SITE_OTHER): Payer: 59 | Admitting: Family Medicine

## 2020-03-20 DIAGNOSIS — R0683 Snoring: Secondary | ICD-10-CM

## 2020-03-20 DIAGNOSIS — F331 Major depressive disorder, recurrent, moderate: Secondary | ICD-10-CM

## 2020-03-20 DIAGNOSIS — R41840 Attention and concentration deficit: Secondary | ICD-10-CM

## 2020-03-20 DIAGNOSIS — L219 Seborrheic dermatitis, unspecified: Secondary | ICD-10-CM

## 2020-03-20 DIAGNOSIS — R400 Somnolence: Secondary | ICD-10-CM | POA: Diagnosis not present

## 2020-03-20 MED ORDER — SERTRALINE HCL 100 MG PO TABS: 100.0000 mg | ORAL_TABLET | Freq: Every day | ORAL | 1 refills | Status: DC

## 2020-03-20 MED ORDER — KETOCONAZOLE 2 % EX SHAM
1.0000 | MEDICATED_SHAMPOO | CUTANEOUS | 0 refills | Status: DC
Start: 2020-03-22 — End: 2021-09-29

## 2020-03-20 MED ORDER — NYSTATIN 100000 UNIT/GM EX POWD: 1.0000 "application " | Freq: Three times a day (TID) | CUTANEOUS | 1 refills | Status: AC

## 2020-03-20 NOTE — Progress Notes (Signed)
Bad snoring Gasping for breath Increasing Zoloft "weird skin condition"

## 2020-03-20 NOTE — Assessment & Plan Note (Signed)
He has symptoms concerning for OSA.  Given his body habitus and symptoms I think it is reasonable for him to be referred for further evaluation of this.

## 2020-03-20 NOTE — Assessment & Plan Note (Signed)
His symptoms are consistent with seborrheic dermatitis.  We will have him try Nizoral 2% shampoo 2-3 times per week as needed.  He will let me know if this is not improving with it.

## 2020-03-20 NOTE — Progress Notes (Signed)
Joel Ferguson - 24 y.o. male MRN [id number]  Date of birth: 09/17/1995   This visit type was conducted due to national recommendations for restrictions regarding the COVID-19 Pandemic (e.g. social distancing).  This format is felt to be most appropriate for this patient at this time.  All issues noted in this document were discussed and addressed.  No physical exam was performed (except for noted visual exam findings with Video Visits).  I discussed the limitations of evaluation and management by telemedicine and the availability of in person appointments. The patient expressed understanding and agreed to proceed.  I connected with@ on 03/20/20 at 10:10 AM EST by a video enabled telemedicine application and verified that I am speaking with the correct person using two identifiers.  Present at visit: [name], DO Eliberto Ivory Ganson   Patient Location: Home [address] [place] [address]   Provider location:   [facility]  No chief complaint on file.   HPI  Joel Ferguson is a 24 y.o. male who presents via Web designer for a telehealth visit today.  He had several concerns today that he would like to discuss.  His initial concern is issues with poor sleep as well as daytime fatigue.  He is not sure if this is related to his continued depression or possible sleep apnea.  He has been told at he snores heavily with possible observed apnea at night.  He does wake occasionally with morning headaches with significant fatigue throughout the day.  He has been working with a therapist and thinks that his Zoloft may need to be increased.  He has been tolerating current dose well at 50 mg.  There is also concern about possible ADHD which could be contributing to some of his anxiety due to poor work performance and difficulty with attention.  His therapist suggested that I may be able to provide something temporary for him to help with management of the symptoms until he can have a formal  evaluation due to difficulty accessing specialist for this evaluation.  He continues to have some flaking as well as itching around his scalp and beard.  He did see a dermatologist previously and was prescribed some type of shampoo but this is a couple years ago and he is not sure if this really helped and is not sure the name of this was.  He has tried over-the-counter dandruff shampoos without much improvement.  ROS:  A comprehensive ROS was completed and negative except as noted per HPI  ROS:  A comprehensive ROS was completed and negative except as noted per HPI  Past Medical History:  Diagnosis Date  . Abscess of knee   . Obesity     Past Surgical History:  Procedure Laterality Date  . WISDOM TOOTH EXTRACTION      Family History  Problem Relation Age of Onset  . Gallstones Mother   . Colon polyps Maternal Grandmother   . Edema Paternal Grandmother   . Pancreatic cancer Other     Social History   Socioeconomic History  . Marital status: Married    Spouse name: Not on file  . Number of children: 1  . Years of education: Not on file  . Highest education level: Not on file  Occupational History  . Not on file  Tobacco Use  . Smoking status: Never Smoker  . Smokeless tobacco: Never Used  Vaping Use  . Vaping Use: Never used  Substance and Sexual Activity  . Alcohol use: Yes  Comment: OCCASSION'S  . Drug use: No  . Sexual activity: Not Currently    Birth control/protection: None  Other Topics Concern  . Not on file  Social History Narrative  . Not on file   Social Determinants of Health   Financial Resource Strain:   . Difficulty of Paying Living Expenses: Not on file  Food Insecurity:   . Worried About Programme researcher, broadcasting/film/video in the Last Year: Not on file  . Ran Out of Food in the Last Year: Not on file  Transportation Needs:   . Lack of Transportation (Medical): Not on file  . Lack of Transportation (Non-Medical): Not on file  Physical Activity:   . Days  of Exercise per Week: Not on file  . Minutes of Exercise per Session: Not on file  Stress:   . Feeling of Stress : Not on file  Social Connections:   . Frequency of Communication with Friends and Family: Not on file  . Frequency of Social Gatherings with Friends and Family: Not on file  . Attends Religious Services: Not on file  . Active Member of Clubs or Organizations: Not on file  . Attends Banker Meetings: Not on file  . Marital Status: Not on file  Intimate Partner Violence:   . Fear of Current or Ex-Partner: Not on file  . Emotionally Abused: Not on file  . Physically Abused: Not on file  . Sexually Abused: Not on file     Current Outpatient Medications:  .  albuterol (VENTOLIN HFA) 108 (90 Base) MCG/ACT inhaler, Inhale 2 puffs into the lungs every 6 (six) hours as needed for wheezing or shortness of breath. (Patient taking differently: Inhale 2 puffs into the lungs every 6 (six) hours as needed for wheezing or shortness of breath. ), Disp: 18 g, Rfl: 1 .  sertraline (ZOLOFT) 100 MG tablet, Take 1 tablet (100 mg total) by mouth daily., Disp: 90 tablet, Rfl: 1 .  [START ON 03/22/2020] ketoconazole (NIZORAL) 2 % shampoo, Apply 1 application topically 2 (two) times a week., Disp: 120 mL, Rfl: 0 .  nystatin (MYCOSTATIN/NYSTOP) powder, Apply 1 application topically 3 (three) times daily., Disp: 60 g, Rfl: 1  EXAM:  VITALS per patient if applicable: There were no vitals taken for this visit.  GENERAL: alert, oriented, appears well and in no acute distress  HEENT: atraumatic, conjunttiva clear, no obvious abnormalities on inspection of external nose and ears  NECK: normal movements of the head and neck  LUNGS: on inspection no signs of respiratory distress, breathing rate appears normal, no obvious gross SOB, gasping or wheezing  CV: no obvious cyanosis  MS: moves all visible extremities without noticeable abnormality  PSYCH/NEURO: pleasant and cooperative, no  obvious depression or anxiety, speech and thought processing grossly intact  ASSESSMENT AND PLAN:  Discussed the following assessment and plan:  Major depression Sertraline increased to 100 mg.  He will continue seeing a therapist.  There is a possibility of some ADHD contributing to some anxiety symptoms.  We will have him complete adult ADHD scale.  Also place a referral to neuropsychiatric center for formal evaluation.  Seborrheic dermatitis His symptoms are consistent with seborrheic dermatitis.  We will have him try Nizoral 2% shampoo 2-3 times per week as needed.  He will let me know if this is not improving with it.  Daytime somnolence He has symptoms concerning for OSA.  Given his body habitus and symptoms I think it is reasonable for him to  be referred for further evaluation of this.     I discussed the assessment and treatment plan with the patient. The patient was provided an opportunity to ask questions and all were answered. The patient agreed with the plan and demonstrated an understanding of the instructions.   The patient was advised to call back or seek an in-person evaluation if the symptoms worsen or if the condition fails to improve as anticipated.    [name], DO

## 2020-03-20 NOTE — Assessment & Plan Note (Signed)
Sertraline increased to 100 mg.  He will continue seeing a therapist.  There is a possibility of some ADHD contributing to some anxiety symptoms.  We will have him complete adult ADHD scale.  Also place a referral to neuropsychiatric center for formal evaluation.

## 2020-03-26 ENCOUNTER — Ambulatory Visit (INDEPENDENT_AMBULATORY_CARE_PROVIDER_SITE_OTHER): Payer: 59 | Admitting: Professional

## 2020-03-26 DIAGNOSIS — F411 Generalized anxiety disorder: Secondary | ICD-10-CM

## 2020-03-27 ENCOUNTER — Encounter: Payer: Self-pay | Admitting: Family Medicine

## 2020-03-28 ENCOUNTER — Other Ambulatory Visit: Payer: Self-pay | Admitting: Family Medicine

## 2020-04-02 ENCOUNTER — Ambulatory Visit (INDEPENDENT_AMBULATORY_CARE_PROVIDER_SITE_OTHER): Payer: 59 | Admitting: Professional

## 2020-04-02 DIAGNOSIS — F411 Generalized anxiety disorder: Secondary | ICD-10-CM

## 2020-04-04 ENCOUNTER — Other Ambulatory Visit: Payer: Self-pay | Admitting: Family Medicine

## 2020-04-04 DIAGNOSIS — G4730 Sleep apnea, unspecified: Secondary | ICD-10-CM

## 2020-04-04 DIAGNOSIS — R400 Somnolence: Secondary | ICD-10-CM

## 2020-04-04 DIAGNOSIS — R0683 Snoring: Secondary | ICD-10-CM

## 2020-04-09 NOTE — Telephone Encounter (Signed)
New sleep study sent family will have to call and cancel the appointment they have set up with [facility]. - CF

## 2020-04-12 ENCOUNTER — Ambulatory Visit (INDEPENDENT_AMBULATORY_CARE_PROVIDER_SITE_OTHER): Payer: 59 | Admitting: Professional

## 2020-04-12 DIAGNOSIS — F411 Generalized anxiety disorder: Secondary | ICD-10-CM

## 2020-04-16 ENCOUNTER — Ambulatory Visit (INDEPENDENT_AMBULATORY_CARE_PROVIDER_SITE_OTHER): Payer: 59 | Admitting: Professional

## 2020-04-16 DIAGNOSIS — F411 Generalized anxiety disorder: Secondary | ICD-10-CM

## 2020-04-18 ENCOUNTER — Ambulatory Visit (INDEPENDENT_AMBULATORY_CARE_PROVIDER_SITE_OTHER): Payer: 59 | Admitting: Psychology

## 2020-04-18 DIAGNOSIS — F320 Major depressive disorder, single episode, mild: Secondary | ICD-10-CM

## 2020-04-30 ENCOUNTER — Ambulatory Visit (INDEPENDENT_AMBULATORY_CARE_PROVIDER_SITE_OTHER): Payer: 59 | Admitting: Professional

## 2020-04-30 DIAGNOSIS — F411 Generalized anxiety disorder: Secondary | ICD-10-CM | POA: Diagnosis not present

## 2020-05-02 ENCOUNTER — Institutional Professional Consult (permissible substitution): Payer: 59 | Admitting: Neurology

## 2020-05-02 ENCOUNTER — Ambulatory Visit (INDEPENDENT_AMBULATORY_CARE_PROVIDER_SITE_OTHER): Payer: 59 | Admitting: Psychology

## 2020-05-02 DIAGNOSIS — F320 Major depressive disorder, single episode, mild: Secondary | ICD-10-CM

## 2020-05-09 ENCOUNTER — Ambulatory Visit (INDEPENDENT_AMBULATORY_CARE_PROVIDER_SITE_OTHER): Payer: 59 | Admitting: Psychology

## 2020-05-09 DIAGNOSIS — F320 Major depressive disorder, single episode, mild: Secondary | ICD-10-CM | POA: Diagnosis not present

## 2020-05-10 ENCOUNTER — Ambulatory Visit: Payer: 59 | Admitting: Psychology

## 2020-05-14 ENCOUNTER — Ambulatory Visit: Payer: 59 | Admitting: Professional

## 2020-05-14 ENCOUNTER — Ambulatory Visit (INDEPENDENT_AMBULATORY_CARE_PROVIDER_SITE_OTHER): Payer: 59 | Admitting: Psychology

## 2020-05-14 DIAGNOSIS — F320 Major depressive disorder, single episode, mild: Secondary | ICD-10-CM | POA: Diagnosis not present

## 2020-05-15 ENCOUNTER — Ambulatory Visit (INDEPENDENT_AMBULATORY_CARE_PROVIDER_SITE_OTHER): Payer: 59 | Admitting: Professional

## 2020-05-15 DIAGNOSIS — F411 Generalized anxiety disorder: Secondary | ICD-10-CM | POA: Diagnosis not present

## 2020-05-17 ENCOUNTER — Ambulatory Visit (INDEPENDENT_AMBULATORY_CARE_PROVIDER_SITE_OTHER): Payer: 59 | Admitting: Psychology

## 2020-05-17 DIAGNOSIS — F320 Major depressive disorder, single episode, mild: Secondary | ICD-10-CM

## 2020-05-28 ENCOUNTER — Ambulatory Visit: Payer: 59 | Admitting: Professional

## 2020-05-29 ENCOUNTER — Ambulatory Visit (INDEPENDENT_AMBULATORY_CARE_PROVIDER_SITE_OTHER): Payer: 59 | Admitting: Professional

## 2020-05-29 DIAGNOSIS — F411 Generalized anxiety disorder: Secondary | ICD-10-CM

## 2020-06-05 ENCOUNTER — Ambulatory Visit (INDEPENDENT_AMBULATORY_CARE_PROVIDER_SITE_OTHER): Payer: 59 | Admitting: Professional

## 2020-06-05 DIAGNOSIS — F331 Major depressive disorder, recurrent, moderate: Secondary | ICD-10-CM

## 2020-06-05 DIAGNOSIS — F411 Generalized anxiety disorder: Secondary | ICD-10-CM | POA: Diagnosis not present

## 2020-06-06 ENCOUNTER — Ambulatory Visit: Payer: 59 | Admitting: Psychology

## 2020-06-06 ENCOUNTER — Ambulatory Visit (INDEPENDENT_AMBULATORY_CARE_PROVIDER_SITE_OTHER): Payer: 59 | Admitting: Psychology

## 2020-06-06 DIAGNOSIS — F320 Major depressive disorder, single episode, mild: Secondary | ICD-10-CM | POA: Diagnosis not present

## 2020-06-11 ENCOUNTER — Ambulatory Visit (INDEPENDENT_AMBULATORY_CARE_PROVIDER_SITE_OTHER): Payer: 59 | Admitting: Professional

## 2020-06-11 ENCOUNTER — Ambulatory Visit (INDEPENDENT_AMBULATORY_CARE_PROVIDER_SITE_OTHER): Payer: 59 | Admitting: Psychology

## 2020-06-11 DIAGNOSIS — F320 Major depressive disorder, single episode, mild: Secondary | ICD-10-CM | POA: Diagnosis not present

## 2020-06-11 DIAGNOSIS — F331 Major depressive disorder, recurrent, moderate: Secondary | ICD-10-CM | POA: Diagnosis not present

## 2020-06-19 ENCOUNTER — Ambulatory Visit (INDEPENDENT_AMBULATORY_CARE_PROVIDER_SITE_OTHER): Payer: 59 | Admitting: Professional

## 2020-06-19 DIAGNOSIS — F411 Generalized anxiety disorder: Secondary | ICD-10-CM

## 2020-06-19 DIAGNOSIS — F331 Major depressive disorder, recurrent, moderate: Secondary | ICD-10-CM | POA: Diagnosis not present

## 2020-06-25 ENCOUNTER — Ambulatory Visit (INDEPENDENT_AMBULATORY_CARE_PROVIDER_SITE_OTHER): Payer: 59 | Admitting: Professional

## 2020-06-25 DIAGNOSIS — F411 Generalized anxiety disorder: Secondary | ICD-10-CM | POA: Diagnosis not present

## 2020-06-25 DIAGNOSIS — F331 Major depressive disorder, recurrent, moderate: Secondary | ICD-10-CM | POA: Diagnosis not present

## 2020-06-29 ENCOUNTER — Ambulatory Visit (INDEPENDENT_AMBULATORY_CARE_PROVIDER_SITE_OTHER): Payer: 59 | Admitting: Psychology

## 2020-06-29 DIAGNOSIS — F320 Major depressive disorder, single episode, mild: Secondary | ICD-10-CM | POA: Diagnosis not present

## 2020-07-02 ENCOUNTER — Ambulatory Visit (INDEPENDENT_AMBULATORY_CARE_PROVIDER_SITE_OTHER): Payer: 59 | Admitting: Professional

## 2020-07-02 DIAGNOSIS — F331 Major depressive disorder, recurrent, moderate: Secondary | ICD-10-CM

## 2020-07-02 DIAGNOSIS — F411 Generalized anxiety disorder: Secondary | ICD-10-CM | POA: Diagnosis not present

## 2020-07-09 ENCOUNTER — Ambulatory Visit (INDEPENDENT_AMBULATORY_CARE_PROVIDER_SITE_OTHER): Payer: 59 | Admitting: Professional

## 2020-07-09 DIAGNOSIS — F331 Major depressive disorder, recurrent, moderate: Secondary | ICD-10-CM | POA: Diagnosis not present

## 2020-07-09 DIAGNOSIS — F411 Generalized anxiety disorder: Secondary | ICD-10-CM

## 2020-07-18 ENCOUNTER — Telehealth (INDEPENDENT_AMBULATORY_CARE_PROVIDER_SITE_OTHER): Payer: 59 | Admitting: Family Medicine

## 2020-07-18 ENCOUNTER — Encounter: Payer: Self-pay | Admitting: Family Medicine

## 2020-07-18 VITALS — Ht 74.0 in | Wt 285.0 lb

## 2020-07-18 DIAGNOSIS — R21 Rash and other nonspecific skin eruption: Secondary | ICD-10-CM | POA: Diagnosis not present

## 2020-07-18 DIAGNOSIS — M549 Dorsalgia, unspecified: Secondary | ICD-10-CM | POA: Diagnosis not present

## 2020-07-18 DIAGNOSIS — L309 Dermatitis, unspecified: Secondary | ICD-10-CM | POA: Insufficient documentation

## 2020-07-18 MED ORDER — TRIAMCINOLONE ACETONIDE 0.1 % EX CREA
1.0000 | TOPICAL_CREAM | Freq: Two times a day (BID) | CUTANEOUS | 0 refills | Status: DC
Start: 2020-07-18 — End: 2022-07-02

## 2020-07-18 MED ORDER — CLOBETASOL PROPIONATE 0.05 % EX SOLN: 1.0000 "application " | Freq: Two times a day (BID) | CUTANEOUS | 2 refills | Status: DC

## 2020-07-18 MED ORDER — FLUCONAZOLE 150 MG PO TABS: 150.0000 mg | ORAL_TABLET | ORAL | 0 refills | Status: AC

## 2020-07-18 NOTE — Progress Notes (Signed)
Back pain gets worse throughout the day. Recurring daily.  Shampoo's not working. Developing scales on the back of neck and arms.

## 2020-07-18 NOTE — Assessment & Plan Note (Signed)
Recommend trial of formal PT.  If not improving with this we can consider MRI

## 2020-07-18 NOTE — Assessment & Plan Note (Signed)
Continued groin rash.  Improved but not resolving with nystatin.  Start fluconazol 150mg  weekly x 4 weeks

## 2020-07-18 NOTE — Assessment & Plan Note (Signed)
Symptoms on scalp and arm consistent with eczema vs psoriasis.  Start clobetasol scalp solution and triamcinolone for areas on extremities. He wil let me know if not improving with this.

## 2020-07-18 NOTE — Progress Notes (Signed)
Joel Ferguson - 25 y.o. male MRN [id number]  Date of birth: September 19, 1995   This visit type was conducted due to national recommendations for restrictions regarding the COVID-19 Pandemic (e.g. social distancing).  This format is felt to be most appropriate for this patient at this time.  All issues noted in this document were discussed and addressed.  No physical exam was performed (except for noted visual exam findings with Video Visits).  I discussed the limitations of evaluation and management by telemedicine and the availability of in person appointments. The patient expressed understanding and agreed to proceed.  I connected with@ on 07/18/20 at  9:10 AM EDT by a video enabled telemedicine application and verified that I am speaking with the correct person using two identifiers.  Present at visit: [name], DO Eliberto Ivory Fries   Patient Location: Home [address] [place] [address]   Provider location:   [facility]  Chief Complaint  Patient presents with  . Back Pain    HPI  Joel Ferguson is a 25 y.o. male who presents via Web designer for a telehealth visit today.  He is following up today on back pain as well as rash.   He has had mid back pain for several months.  Pain comes and goes.  No radiation of pain, numbness, tingling, bowel or bladder dysfunction.  Previous xrays unremarkable.  He has tried OTC medications with mild relief.   He also continues to have scaly rash on back of scalp  And neck as well as arms.  He didn't have much improvement with nizoral.  No prior history of eczema or psoriasis.    He also has different rash in groin.  Improved with nystatin but hasn't resolved.  This remains itchy.   ROS:  A comprehensive ROS was completed and negative except as noted per HPI  Past Medical History:  Diagnosis Date  . Abscess of knee   . Obesity     Past Surgical History:  Procedure Laterality Date  . WISDOM TOOTH EXTRACTION      Family  History  Problem Relation Age of Onset  . Gallstones Mother   . Colon polyps Maternal Grandmother   . Edema Paternal Grandmother   . Pancreatic cancer Other     Social History   Socioeconomic History  . Marital status: Married    Spouse name: Not on file  . Number of children: 1  . Years of education: Not on file  . Highest education level: Not on file  Occupational History  . Not on file  Tobacco Use  . Smoking status: Never Smoker  . Smokeless tobacco: Never Used  Vaping Use  . Vaping Use: Never used  Substance and Sexual Activity  . Alcohol use: Yes    Comment: OCCASSION'S  . Drug use: No  . Sexual activity: Not Currently    Birth control/protection: None  Other Topics Concern  . Not on file  Social History Narrative  . Not on file   Social Determinants of Health   Financial Resource Strain: Not on file  Food Insecurity: Not on file  Transportation Needs: Not on file  Physical Activity: Not on file  Stress: Not on file  Social Connections: Not on file  Intimate Partner Violence: Not on file     Current Outpatient Medications:  .  albuterol (VENTOLIN HFA) 108 (90 Base) MCG/ACT inhaler, Inhale 2 puffs into the lungs every 6 (six) hours as needed for wheezing or shortness of breath., Disp:  18 g, Rfl: 1 .  clobetasol (TEMOVATE) 0.05 % external solution, Apply 1 application topically 2 (two) times daily. Use on scalp/neck, Disp: 50 mL, Rfl: 2 .  fluconazole (DIFLUCAN) 150 MG tablet, Take 1 tablet (150 mg total) by mouth once a week for 4 doses., Disp: 4 tablet, Rfl: 0 .  ketoconazole (NIZORAL) 2 % shampoo, Apply 1 application topically 2 (two) times a week., Disp: 120 mL, Rfl: 0 .  nystatin (MYCOSTATIN/NYSTOP) powder, Apply 1 application topically 3 (three) times daily., Disp: 60 g, Rfl: 1 .  sertraline (ZOLOFT) 100 MG tablet, Take 1 tablet (100 mg total) by mouth daily., Disp: 90 tablet, Rfl: 1 .  triamcinolone (KENALOG) 0.1 %, Apply 1 application topically 2 (two)  times daily. Use for rash on arms as needed., Disp: 30 g, Rfl: 0  EXAM:  VITALS per patient if applicable: Ht 6\' 2"  (1.88 m)   Wt 285 lb (129.3 kg)   BMI 36.59 kg/m   GENERAL: alert, oriented, appears well and in no acute distress  HEENT: atraumatic, conjunttiva clear, no obvious abnormalities on inspection of external nose and ears  NECK: normal movements of the head and neck  LUNGS: on inspection no signs of respiratory distress, breathing rate appears normal, no obvious gross SOB, gasping or wheezing  CV: no obvious cyanosis  MS: moves all visible extremities without noticeable abnormality  PSYCH/NEURO: pleasant and cooperative, no obvious depression or anxiety, speech and thought processing grossly intact  ASSESSMENT AND PLAN:  Discussed the following assessment and plan:  Mid back pain Recommend trial of formal PT.  If not improving with this we can consider MRI  Eczema Symptoms on scalp and arm consistent with eczema vs psoriasis.  Start clobetasol scalp solution and triamcinolone for areas on extremities. He wil let me know if not improving with this.     Rash Continued groin rash.  Improved but not resolving with nystatin.  Start fluconazol 150mg  weekly x 4 weeks     I discussed the assessment and treatment plan with the patient. The patient was provided an opportunity to ask questions and all were answered. The patient agreed with the plan and demonstrated an understanding of the instructions.   The patient was advised to call back or seek an in-person evaluation if the symptoms worsen or if the condition fails to improve as anticipated.    , DO

## 2020-07-23 ENCOUNTER — Ambulatory Visit (INDEPENDENT_AMBULATORY_CARE_PROVIDER_SITE_OTHER): Payer: 59 | Admitting: Professional

## 2020-07-23 DIAGNOSIS — F331 Major depressive disorder, recurrent, moderate: Secondary | ICD-10-CM

## 2020-07-23 DIAGNOSIS — F411 Generalized anxiety disorder: Secondary | ICD-10-CM | POA: Diagnosis not present

## 2020-07-24 ENCOUNTER — Other Ambulatory Visit: Payer: Self-pay

## 2020-07-24 ENCOUNTER — Ambulatory Visit (INDEPENDENT_AMBULATORY_CARE_PROVIDER_SITE_OTHER): Payer: 59 | Admitting: Rehabilitative and Restorative Service Providers"

## 2020-07-24 DIAGNOSIS — M546 Pain in thoracic spine: Secondary | ICD-10-CM

## 2020-07-24 DIAGNOSIS — R293 Abnormal posture: Secondary | ICD-10-CM | POA: Diagnosis not present

## 2020-07-24 DIAGNOSIS — R29898 Other symptoms and signs involving the musculoskeletal system: Secondary | ICD-10-CM

## 2020-07-24 NOTE — Patient Instructions (Signed)
Access Code: [email or web address]: [email or web address]: 04/12/2022Prepared by: Dondrea Clendenin HoltExercises  Prone Press Up - 2 x daily - 7 x weekly - 1 sets - 10 reps - 2-3 sec hold  Prone Press Up On Elbows - 2 x daily - 7 x weekly - 1 sets - 3 reps - 30 sec hold  Doorway Pec Stretch at 60 Degrees Abduction - 3 x daily - 7 x weekly - 3 reps - 1 sets  Doorway Pec Stretch at 90 Degrees Abduction - 3 x daily - 7 x weekly - 3 reps - 1 sets - 30 seconds hold  Doorway Pec Stretch at 120 Degrees Abduction - 3 x daily - 7 x weekly - 3 reps - 1 sets - 30 second hold hold Patient Education  TENS Unit  Office Posture

## 2020-07-24 NOTE — Therapy (Signed)
[facility] [place] [address] [place], [place], [address] Phone: [phone]   Fax:  [phone]  Physical Therapy Evaluation  Patient Details  Name: Joel Ferguson MRN: [id number] Date of Birth: [birth year] Referring Provider (PT): Dr [name]   Encounter Date: 07/24/2020   PT End of Session - 07/24/20 1207    Visit Number 1    Number of Visits 12    Date for PT Re-Evaluation 09/04/20    PT Start Time 0800    PT Stop Time 0845    PT Time Calculation (min) 45 min    Activity Tolerance Patient tolerated treatment well           Past Medical History:  Diagnosis Date  . Abscess of knee   . Obesity     Past Surgical History:  Procedure Laterality Date  . WISDOM TOOTH EXTRACTION      There were no vitals filed for this visit.    Subjective Assessment - 07/24/20 0806    Subjective Patient reports that he has had pain in the center Rt side of the midback. Pain is increased with prolonged standing or lifting. His Lt arm will feel numb when he sneezes. Pain increases through the day and is better in the morning but notices stiffness in the midback.    Pertinent History anxiety, depression; knee injuries; headaches    Patient Stated Goals get rid of the pain    Currently in Pain? Yes    Pain Score 0-No pain    Pain Location Thoracic    Pain Orientation Right    Pain Descriptors / Indicators Burning;Sharp    Pain Type Chronic pain    Pain Radiating Towards numbness in the Lt UE when he sneezes    Pain Onset More than a month ago    Pain Frequency Intermittent   every day   Aggravating Factors  prolonged standing; lifting; walking    Pain Relieving Factors lying down; ice pack              [facility] PT Assessment - 07/24/20 0001      Assessment   Medical Diagnosis Midback pain    Referring Provider (PT) Dr [name]    Onset Date/Surgical Date 04/14/17   worse in the past year   Hand Dominance Right     Next MD Visit 6 wks after PT    Prior Therapy none      Precautions   Precautions None      Restrictions   Weight Bearing Restrictions No      Balance Screen   Has the patient fallen in the past 6 months No    Has the patient had a decrease in activity level because of a fear of falling?  No    Is the patient reluctant to leave their home because of a fear of falling?  No      Home [employer] information centre manager residence    Living Arrangements Spouse/significant other;Children      Prior Function   Level of Independence Independent    Vocation Full time employment    Optometrist - 5 yrs    Leisure cooking; household chores; child care 3 yr old; raises ducks      Observation/Other Assessments   Focus on Therapeutic Outcomes (FOTO)  43      Sensation   Additional Comments numbness in Lt arm ulnar side to fingers when he sneezes  Posture/Postural Control   Posture Comments head forward; shoulders rounded and elevated; scapulae abducted and rotated along the thoracic wall; head of the humerus anterior in orientation      AROM   Cervical Flexion tight pulling    Cervical - Left Side Bend tight Lt cervical    Lumbar Flexion 80% pulling    Lumbar Extension 40%    Lumbar - Right Side Bend 60%    Lumbar - Left Side Bend 60%    Lumbar - Right Rotation 50%    Lumbar - Left Rotation 50%    Thoracic Extension limited      Strength   Overall Strength Comments 5/5 UE's      Palpation   Spinal mobility hypomobile thoracolumbar junction; mid thoracic spine with PA mobs and Rt lateral mobs                      Objective measurements completed on examination: See above findings.       [facility] Adult PT Treatment/Exercise - 07/24/20 0001      Self-Care   Other Self-Care Comments  instructed in use of foam roll for sitting posture      Neuro Re-ed    Neuro Re-ed Details  initiated postural correction/education       Exercises   Other Exercises  prone press up 2-3 sec x 5 reps; prone prop 1-2 min x 1 rep      Shoulder Exercises: Stretch   Other Shoulder Stretches doorway stretch 3 positions 30 sec hold x 2 reps each position                  PT Education - 07/24/20 0838    Education Details POC HEP TENS office ergonomics    Person(s) Educated Patient    Methods Explanation;Demonstration;Tactile cues;Verbal cues;Handout    Comprehension Verbalized understanding;Returned demonstration;Verbal cues required;Tactile cues required               PT Long Term Goals - 07/24/20 1213      PT LONG TERM GOAL #1   Title Improve postue and alignment with patient to demonstrate improved upright posture with posterior shoulder girdle musculature engaged    Time 6    Period Weeks    Status New    Target Date 09/04/20      PT LONG TERM GOAL #2   Title Decrease pain to ,/= 0/10 to 1/10 for all functional activities    Time 6    Period Weeks    Status New    Target Date 09/04/20      PT LONG TERM GOAL #3   Title Patient to verbalize and demonstrate correct sitting posture and alignment for desk/computer work as well as functional activities such as lifting his 72 yr old son and working in the kitchen    Time 6    Period Weeks    Status New    Target Date 09/04/20      PT LONG TERM GOAL #4   Title Independent in HEP    Time 6    Period Weeks    Status New    Target Date 09/04/20      PT LONG TERM GOAL #5   Title Improve functional limitation score to 59    Time 6    Period Weeks    Status New    Target Date 09/04/20  Plan - 07/24/20 1208    Clinical Impression Statement Patient presents with a 3 year history of mid back pain which has been worse in the past year. Pain is primarily in the Rt mid thoracic spine area with occasional numbness into the ulnar Lt arm to the fingers. Symptoms are worse toward the end of the day but he does awaken with stiffness in the  midback. Patient has poor posture and alignment; limited trunk mobility/ROM; pain and tightness with spring testing through the thoracic spine; pain limiting functional activities. Patient will benefit fom PT to address problems identified.    Stability/Clinical Decision Making Stable/Uncomplicated    Clinical Decision Making Low    Rehab Potential Good    PT Frequency 2x / week    PT Duration 6 weeks    PT Treatment/Interventions ADLs/Self Care Home Management;Aquatic Therapy;Cryotherapy;Electrical Stimulation;Iontophoresis 4mg /ml Dexamethasone;Moist Heat;Ultrasound;Functional mobility training;Therapeutic activities;Therapeutic exercise;Neuromuscular re-education;Patient/family education;Manual techniques;Dry needling;Passive range of motion;Taping    PT Next Visit Plan review HEP; assess ergonomics for work station at computer; thoracic mobs sitting; add strengthening for posterior shoulder girdle; modalities as indicated; neuromuscular re-education; trial of taping as indicated    PT Home Exercise Plan [email or web address]    Consulted and Agree with Plan of Care Patient           Patient will benefit from skilled therapeutic intervention in order to improve the following deficits and impairments:  Decreased range of motion,Increased fascial restricitons,Pain,Hypomobility,Decreased mobility,Postural dysfunction,Improper body mechanics,Impaired flexibility  Visit Diagnosis: Pain in thoracic spine  Other symptoms and signs involving the musculoskeletal system  Abnormal posture     Problem List Patient Active Problem List   Diagnosis Date Noted  . Eczema 07/18/2020  . Daytime somnolence 03/20/2020  . Seborrheic dermatitis 03/20/2020  . Well adult exam 11/15/2019  . Mid back pain 11/15/2019  . Rash 11/15/2019  . Acute bronchitis 11/08/2019  . Gastroesophageal reflux disease 09/23/2017  . Precordial chest pain 09/23/2017  . Loose stools 09/23/2017  . Fecal urgency 09/23/2017  .  Environmental allergies 09/23/2017  . Eustachian tube dysfunction, left 09/23/2017  . Major depression 02/18/2017  . Post-nasal drainage 02/18/2017  . Generalized abdominal pain 02/18/2017  . Dyspepsia 02/18/2017  . Class 2 obesity due to excess calories without serious comorbidity with body mass index (BMI) of 35.0 to 35.9 in adult 02/18/2017    Joel Ferguson 13/10/2016 PT, MPH  07/24/2020, 12:18 PM  [facility] [address] [place], [place], [place] Phone: [phone]   Fax:  [phone]  Name: Joel Ferguson MRN: Radene Ou Date of Birth: [birth year]

## 2020-07-30 ENCOUNTER — Ambulatory Visit (INDEPENDENT_AMBULATORY_CARE_PROVIDER_SITE_OTHER): Payer: 59 | Admitting: Professional

## 2020-07-30 DIAGNOSIS — F411 Generalized anxiety disorder: Secondary | ICD-10-CM

## 2020-07-30 DIAGNOSIS — F331 Major depressive disorder, recurrent, moderate: Secondary | ICD-10-CM | POA: Diagnosis not present

## 2020-08-01 ENCOUNTER — Encounter: Payer: 59 | Admitting: Rehabilitative and Restorative Service Providers"

## 2020-08-03 ENCOUNTER — Telehealth: Payer: Self-pay | Admitting: Rehabilitative and Restorative Service Providers"

## 2020-08-03 ENCOUNTER — Encounter: Payer: 59 | Admitting: Physical Therapy

## 2020-08-03 NOTE — Telephone Encounter (Signed)
Patient called to cancel appointment due to illness. He had questions re- TENS unit which were addressed by PT. Patient is scheduled for appts next week.  Siboney Requejo P. Leonor Liv PT, MPH 08/03/20 12:32 PM

## 2020-08-06 ENCOUNTER — Ambulatory Visit: Payer: 59 | Admitting: Professional

## 2020-08-07 ENCOUNTER — Other Ambulatory Visit: Payer: Self-pay

## 2020-08-07 ENCOUNTER — Ambulatory Visit (INDEPENDENT_AMBULATORY_CARE_PROVIDER_SITE_OTHER): Payer: 59 | Admitting: Rehabilitative and Restorative Service Providers"

## 2020-08-07 ENCOUNTER — Encounter: Payer: Self-pay | Admitting: Rehabilitative and Restorative Service Providers"

## 2020-08-07 DIAGNOSIS — M546 Pain in thoracic spine: Secondary | ICD-10-CM | POA: Diagnosis not present

## 2020-08-07 DIAGNOSIS — R293 Abnormal posture: Secondary | ICD-10-CM

## 2020-08-07 DIAGNOSIS — R29898 Other symptoms and signs involving the musculoskeletal system: Secondary | ICD-10-CM

## 2020-08-07 NOTE — Therapy (Signed)
[facility] [place] [address] [place], [place], [address] Phone: [phone]   Fax:  [phone]  Physical Therapy Treatment  Patient Details  Name: Joel Ferguson MRN: [id number] Date of Birth: 1995-04-19 Referring Provider (PT): Dr [name]   Encounter Date: 08/07/2020   PT End of Session - 08/07/20 1157    Visit Number 2    Number of Visits 12    Date for PT Re-Evaluation 09/04/20    PT Start Time 1100    PT Stop Time 1145    PT Time Calculation (min) 45 min    Activity Tolerance Patient tolerated treatment well           Past Medical History:  Diagnosis Date  . Abscess of knee   . Obesity     Past Surgical History:  Procedure Laterality Date  . WISDOM TOOTH EXTRACTION      There were no vitals filed for this visit.   Subjective Assessment - 08/07/20 1106    Subjective Patient reports that he is feeling some better - pain is less frequent, maybe less intense. Feels stretches are helping.    Currently in Pain? Yes    Pain Score 1     Pain Location Thoracic    Pain Orientation Right    Pain Descriptors / Indicators Burning;Sharp    Pain Type Chronic pain              [facility] PT Assessment - 08/07/20 0001      Assessment   Medical Diagnosis Midback pain    Referring Provider (PT) Dr [name]    Onset Date/Surgical Date 04/14/17    Hand Dominance Right    Next MD Visit 6 wks after PT      Palpation   Spinal mobility hypomobile thoracolumbar junction; mid thoracic spine with PA mobs and Rt lateral mobs    Palpation comment oalpable tightness through Rt > Lt thoraclumbar paraspinals                         [facility] Adult PT Treatment/Exercise - 08/07/20 0001      Self-Care   Self-Care Posture    Posture review/instruction in computer/desk egonomics      Therapeutic Activites    Therapeutic Activities Other Therapeutic Activities      Neuro Re-ed    Neuro Re-ed Details   postural correction/education      Exercises   Other Exercises  prone press up 2-3 sec x 10 reps; prone prop 1-2 min x 1 rep      Shoulder Exercises: Supine   Other Supine Exercises lying on coregous ball T12/L1 area 1-2 min x 2 reps      Shoulder Exercises: Prone   Retraction Strengthening;Both;5 reps   10 sec hold     Shoulder Exercises: Standing   Extension Strengthening;10 reps;Theraband    Theraband Level (Shoulder Extension) Level 4 (Blue)    Row Strengthening;10 reps;Theraband    Theraband Level (Shoulder Row) Level 4 (Blue)    Retraction Strengthening;Both;10 reps;Theraband    Theraband Level (Shoulder Retraction) Level 2 (Red)    Other Standing Exercises lat pull blue TB x 10 reps      Shoulder Exercises: Stretch   Other Shoulder Stretches doorway stretch 3 positions 30 sec hold x 2 reps each position; shoulder flexion hands overhead in doorway 30 sec x 2 reps      Manual Therapy   Manual therapy comments  pt prone    Joint Mobilization thoracolumbar PA and lateral mobs Grade II/III    Soft tissue mobilization deep tissue work thoracolumbar paraspinals                  PT Education - 08/07/20 1140    Education Details HEP DN    Person(s) Educated Patient    Methods Explanation;Demonstration;Tactile cues;Verbal cues;Handout    Comprehension Verbalized understanding;Returned demonstration;Verbal cues required;Tactile cues required               PT Long Term Goals - 07/24/20 1213      PT LONG TERM GOAL #1   Title Improve postue and alignment with patient to demonstrate improved upright posture with posterior shoulder girdle musculature engaged    Time 6    Period Weeks    Status New    Target Date 09/04/20      PT LONG TERM GOAL #2   Title Decrease pain to ,/= 0/10 to 1/10 for all functional activities    Time 6    Period Weeks    Status New    Target Date 09/04/20      PT LONG TERM GOAL #3   Title Patient to verbalize and demonstrate correct  sitting posture and alignment for desk/computer work as well as functional activities such as lifting his 25 yr old son and working in the kitchen    Time 6    Period Weeks    Status New    Target Date 09/04/20      PT LONG TERM GOAL #4   Title Independent in HEP    Time 6    Period Weeks    Status New    Target Date 09/04/20      PT LONG TERM GOAL #5   Title Improve functional limitation score to 59    Time 6    Period Weeks    Status New    Target Date 09/04/20                 Plan - 08/07/20 1108    Clinical Impression Statement Some improvement in symptoms with initial HEP. Reviewed HEP and added stretching and strengthening exercises as well as myofacial ball release work in spine with coregous ball. Issued TB for home. Education in Chief of Staff posture.    Rehab Potential Good    PT Frequency 2x / week    PT Duration 6 weeks    PT Treatment/Interventions ADLs/Self Care Home Management;Aquatic Therapy;Cryotherapy;Electrical Stimulation;Iontophoresis 4mg /ml Dexamethasone;Moist Heat;Ultrasound;Functional mobility training;Therapeutic activities;Therapeutic exercise;Neuromuscular re-education;Patient/family education;Manual techniques;Dry needling;Passive range of motion;Taping    PT Next Visit Plan review HEP; review ergonomics for work station at computer as needed; thoracic mobs prone and sitting; review and progress strengthening for posterior shoulder girdle (prone series); modalities as indicated; neuromuscular re-education; trial of taping and DN as indicated    PT Home Exercise Plan [email or web address]    Consulted and Agree with Plan of Care Patient           Patient will benefit from skilled therapeutic intervention in order to improve the following deficits and impairments:     Visit Diagnosis: Pain in thoracic spine  Other symptoms and signs involving the musculoskeletal system  Abnormal posture     Problem List Patient Active Problem List    Diagnosis Date Noted  . Eczema 07/18/2020  . Daytime somnolence 03/20/2020  . Seborrheic dermatitis 03/20/2020  . Well adult exam 11/15/2019  . Mid back pain 11/15/2019  .  Rash 11/15/2019  . Acute bronchitis 11/08/2019  . Gastroesophageal reflux disease 09/23/2017  . Precordial chest pain 09/23/2017  . Loose stools 09/23/2017  . Fecal urgency 09/23/2017  . Environmental allergies 09/23/2017  . Eustachian tube dysfunction, left 09/23/2017  . Major depression 02/18/2017  . Post-nasal drainage 02/18/2017  . Generalized abdominal pain 02/18/2017  . Dyspepsia 02/18/2017  . Class 2 obesity due to excess calories without serious comorbidity with body mass index (BMI) of 35.0 to 35.9 in adult 02/18/2017    Sharlynn Seckinger Rober Minion PT, MPH  08/07/2020, 11:58 AM  [facility] [address] [place], [place], [address] Phone: [phone]   Fax:  [phone]  Name: Tristyn Demarest MRN: [id number] Date of Birth: 1995-12-10

## 2020-08-07 NOTE — Patient Instructions (Addendum)
Trigger Point Dry Needling  . What is Trigger Point Dry Needling (DN)? o DN is a physical therapy technique used to treat muscle pain and dysfunction. Specifically, DN helps deactivate muscle trigger points (muscle knots).  o A thin filiform needle is used to penetrate the skin and stimulate the underlying trigger point. The goal is for a local twitch response (LTR) to occur and for the trigger point to relax. No medication of any kind is injected during the procedure.   . What Does Trigger Point Dry Needling Feel Like?  o The procedure feels different for each individual patient. Some patients report that they do not actually feel the needle enter the skin and overall the process is not painful. Very mild bleeding may occur. However, many patients feel a deep cramping in the muscle in which the needle was inserted. This is the local twitch response.   Marland Kitchen How Will I feel after the treatment? o Soreness is normal, and the onset of soreness may not occur for a few hours. Typically this soreness does not last longer than two days.  o Bruising is uncommon, however; ice can be used to decrease any possible bruising.  o In rare cases feeling tired or nauseous after the treatment is normal. In addition, your symptoms may get worse before they get better, this period will typically not last longer than 24 hours.   . What Can I do After My Treatment? o Increase your hydration by drinking more water for the next 24 hours. o You may place ice or heat on the areas treated that have become sore, however, do not use heat on inflamed or bruised areas. Heat often brings more relief post needling. o You can continue your regular activities, but vigorous activity is not recommended initially after the treatment for 24 hours. o DN is best combined with other physical therapy such as strengthening, stretching, and other therapies.    Access Code: [email or web address]: [email or web address]:  04/26/2022Prepared by: Aundre Hietala HoltExercises  Prone Press Up - 2 x daily - 7 x weekly - 1 sets - 10 reps - 2-3 sec hold  Prone Press Up On Elbows - 2 x daily - 7 x weekly - 1 sets - 3 reps - 30 sec hold  Doorway Pec Stretch at 60 Degrees Abduction - 3 x daily - 7 x weekly - 3 reps - 1 sets  Doorway Pec Stretch at 90 Degrees Abduction - 3 x daily - 7 x weekly - 3 reps - 1 sets - 30 seconds hold  Doorway Pec Stretch at 120 Degrees Abduction - 3 x daily - 7 x weekly - 3 reps - 1 sets - 30 second hold hold  Standing Shoulder External Rotation with Resistance - 2 x daily - 7 x weekly - 1-3 sets - 10 reps - 2-3 sec hold  Standing Bilateral Low Shoulder Row with Anchored Resistance - 2 x daily - 7 x weekly - 1-3 sets - 10 reps - 2-3 sec hold  Shoulder Extension with Resistance - 2 x daily - 7 x weekly - 1-3 sets - 10 reps - 2-3 sec hold  Standing High Row with Resistance - 2 x daily - 7 x weekly - 1 sets - 10 reps - 3-5 sec hold  Prone Shoulder Extension - 2 x daily - 7 x weekly - 1 sets - 10 reps - 3-5 sec hold

## 2020-08-09 ENCOUNTER — Other Ambulatory Visit: Payer: Self-pay

## 2020-08-09 ENCOUNTER — Ambulatory Visit (INDEPENDENT_AMBULATORY_CARE_PROVIDER_SITE_OTHER): Payer: 59 | Admitting: Rehabilitative and Restorative Service Providers"

## 2020-08-09 ENCOUNTER — Encounter: Payer: Self-pay | Admitting: Rehabilitative and Restorative Service Providers"

## 2020-08-09 DIAGNOSIS — R29898 Other symptoms and signs involving the musculoskeletal system: Secondary | ICD-10-CM

## 2020-08-09 DIAGNOSIS — R293 Abnormal posture: Secondary | ICD-10-CM

## 2020-08-09 DIAGNOSIS — M546 Pain in thoracic spine: Secondary | ICD-10-CM

## 2020-08-09 NOTE — Patient Instructions (Addendum)
Access Code: [email or web address]: [email or web address]: 04/28/2022Prepared by: Levy Cedano HoltExercises  Prone Press Up - 2 x daily - 7 x weekly - 1 sets - 10 reps - 2-3 sec hold  Prone Press Up On Elbows - 2 x daily - 7 x weekly - 1 sets - 3 reps - 30 sec hold  Doorway Pec Stretch at 60 Degrees Abduction - 3 x daily - 7 x weekly - 3 reps - 1 sets  Doorway Pec Stretch at 90 Degrees Abduction - 3 x daily - 7 x weekly - 3 reps - 1 sets - 30 seconds hold  Doorway Pec Stretch at 120 Degrees Abduction - 3 x daily - 7 x weekly - 3 reps - 1 sets - 30 second hold hold  Standing Shoulder External Rotation with Resistance - 2 x daily - 7 x weekly - 1-3 sets - 10 reps - 2-3 sec hold  Standing Bilateral Low Shoulder Row with Anchored Resistance - 2 x daily - 7 x weekly - 1-3 sets - 10 reps - 2-3 sec hold  Shoulder Extension with Resistance - 2 x daily - 7 x weekly - 1-3 sets - 10 reps - 2-3 sec hold  Standing High Row with Resistance - 2 x daily - 7 x weekly - 1 sets - 10 reps - 3-5 sec hold  Prone Shoulder Extension - 2 x daily - 7 x weekly - 1 sets - 10 reps - 3-5 sec hold  Cat-Camel - 2 x daily - 7 x weekly - 1 sets - 5-10 reps - 3-5 sec hold  Seated Thoracic Lumbar Extension - 2 x daily - 7 x weekly - 1 sets - 3 reps - 10-15 sec hold  Seated Thoracic Flexion and Rotation with Arms Crossed - 2 x daily - 7 x weekly - 1 sets - 3 reps - 30 sec hold  Seated Thoracic Extension and Rotation with Reach - 2 x daily - 7 x weekly - 1 sets - 3 reps - 10 sec hold   Trigger Point Dry Needling  . What is Trigger Point Dry Needling (DN)? o DN is a physical therapy technique used to treat muscle pain and dysfunction. Specifically, DN helps deactivate muscle trigger points (muscle knots).  o A thin filiform needle is used to penetrate the skin and stimulate the underlying trigger point. The goal is for a local twitch response (LTR) to occur and for the trigger point to relax. No medication of any kind is injected  during the procedure.   . What Does Trigger Point Dry Needling Feel Like?  o The procedure feels different for each individual patient. Some patients report that they do not actually feel the needle enter the skin and overall the process is not painful. Very mild bleeding may occur. However, many patients feel a deep cramping in the muscle in which the needle was inserted. This is the local twitch response.   Marland Kitchen How Will I feel after the treatment? o Soreness is normal, and the onset of soreness may not occur for a few hours. Typically this soreness does not last longer than two days.  o Bruising is uncommon, however; ice can be used to decrease any possible bruising.  o In rare cases feeling tired or nauseous after the treatment is normal. In addition, your symptoms may get worse before they get better, this period will typically not last longer than 24 hours.   . What Can I do After My Treatment? o Increase your hydration  by drinking more water for the next 24 hours. o You may place ice or heat on the areas treated that have become sore, however, do not use heat on inflamed or bruised areas. Heat often brings more relief post needling. o You can continue your regular activities, but vigorous activity is not recommended initially after the treatment for 24 hours. o DN is best combined with other physical therapy such as strengthening, stretching, and other therapies.

## 2020-08-09 NOTE — Therapy (Signed)
[facility] [place] [address] [place] [address] [place], [place], [address] Phone: [phone]   Fax:  [phone]  Physical Therapy Treatment  Patient Details  Name: Joel Ferguson MRN: [id number] Date of Birth: 05/25/1995 Referring Provider (PT): Dr [name]   Encounter Date: 08/09/2020   PT End of Session - 08/09/20 1102    Visit Number 3    Number of Visits 12    Date for PT Re-Evaluation 09/04/20    PT Start Time 1101    PT Stop Time 1149   MH end of treatment   PT Time Calculation (min) 48 min    Activity Tolerance Patient tolerated treatment well           Past Medical History:  Diagnosis Date  . Abscess of knee   . Obesity     Past Surgical History:  Procedure Laterality Date  . WISDOM TOOTH EXTRACTION      There were no vitals filed for this visit.   Subjective Assessment - 08/09/20 1102    Subjective Back has been sore since doing the strengthening exercises and stretches. Not sure if he is making progress. He is working on the exercises at home.    Currently in Pain? No/denies    Pain Score 0-No pain              [facility] PT Assessment - 08/09/20 0001      Assessment   Medical Diagnosis Midback pain    Referring Provider (PT) Dr [name]    Onset Date/Surgical Date 04/14/17    Hand Dominance Right    Next MD Visit 6 wks after PT    Prior Therapy none      Palpation   Spinal mobility hypomobile thoracolumbar junction and mid thoracic spine with PA mobs and Rt lateral mobs    Palpation comment palpable tightness through Rt > Lt thoraclumbar paraspinals bilat; lower traps Rt                         [facility] Adult PT Treatment/Exercise - 08/09/20 0001      Exercises   Other Exercises  prone press up 2-3 sec x 10 reps; prone prop 1-2 min x 1 rep      Shoulder Exercises: Supine   Other Supine Exercises lying on coregeous ball T12/L1 area 1-2 min x 2 reps      Shoulder Exercises: Prone    Retraction Strengthening;Both;5 reps   10 sec hold     Shoulder Exercises: ROM/Strengthening   UBE (Upper Arm Bike) L5 x 3 min alt fwd/back      Shoulder Exercises: Stretch   Other Shoulder Stretches doorway stretch 3 positions 30 sec hold x 2 reps each position; shoulder flexion hands overhead in doorway 30 sec x 2 reps    Other Shoulder Stretches thoracic extension over chair 10 sec x 3 reps; thoracic rotation holding chair and with UE's flexed overhead 10 sec x 3 reps      Moist Heat Therapy   Number Minutes Moist Heat 10 Minutes    Moist Heat Location --   thoracic spine     Electrical Stimulation   Electrical Stimulation Location Lt x 1; Rt x 3 thoracolumbar area    Electrical Stimulation Action TENS    Electrical Stimulation Parameters to tolerance    Electrical Stimulation Goals Pain;Tone      Manual Therapy   Manual therapy comments skilled palpation to assess response  to DN and manual work    Joint Mobilization thoracolumbar PA and lateral mobs Grade II/III    Soft tissue mobilization deep tissue work thoracolumbar paraspinals    Myofascial Release thoracic spine            Trigger Point Dry Needling - 08/09/20 0001    Consent Given? Yes    Education Handout Provided Yes    Other Dry Needling bilat thoracic paraspinals; Rt lower trap    Lower trapezius Response Palpable increased muscle length    Thoracic multifidi response Palpable increased muscle length                PT Education - 08/09/20 1139    Education Details HEP DN    Person(s) Educated Patient    Methods Explanation;Demonstration;Tactile cues;Verbal cues;Handout    Comprehension Verbalized understanding;Returned demonstration;Verbal cues required;Tactile cues required               PT Long Term Goals - 07/24/20 1213      PT LONG TERM GOAL #1   Title Improve postue and alignment with patient to demonstrate improved upright posture with posterior shoulder girdle musculature engaged     Time 6    Period Weeks    Status New    Target Date 09/04/20      PT LONG TERM GOAL #2   Title Decrease pain to ,/= 0/10 to 1/10 for all functional activities    Time 6    Period Weeks    Status New    Target Date 09/04/20      PT LONG TERM GOAL #3   Title Patient to verbalize and demonstrate correct sitting posture and alignment for desk/computer work as well as functional activities such as lifting his 51 yr old son and working in the kitchen    Time 6    Period Weeks    Status New    Target Date 09/04/20      PT LONG TERM GOAL #4   Title Independent in HEP    Time 6    Period Weeks    Status New    Target Date 09/04/20      PT LONG TERM GOAL #5   Title Improve functional limitation score to 59    Time 6    Period Weeks    Status New    Target Date 09/04/20                 Plan - 08/09/20 1105    Clinical Impression Statement Continued symptoms at about the same level. Has been consistent with exercises but has had difficulty changing ergonomics for desk.    Rehab Potential Good    PT Frequency 2x / week    PT Duration 6 weeks    PT Treatment/Interventions ADLs/Self Care Home Management;Aquatic Therapy;Cryotherapy;Electrical Stimulation;Iontophoresis 4mg /ml Dexamethasone;Moist Heat;Ultrasound;Functional mobility training;Therapeutic activities;Therapeutic exercise;Neuromuscular re-education;Patient/family education;Manual techniques;Dry needling;Passive range of motion;Taping    PT Next Visit Plan review HEP; review ergonomics for work station at computer as needed; thoracic mobs prone and sitting; review and progress strengthening for posterior shoulder girdle (prone series); modalities as indicated; neuromuscular re-education; trial of taping and assess response to DN and continue as indicated    PT Home Exercise Plan [email or web address]    Consulted and Agree with Plan of Care Patient           Patient will benefit from skilled therapeutic intervention in order to  improve the following deficits and impairments:  Visit Diagnosis: Pain in thoracic spine  Other symptoms and signs involving the musculoskeletal system  Abnormal posture     Problem List Patient Active Problem List   Diagnosis Date Noted  . Eczema 07/18/2020  . Daytime somnolence 03/20/2020  . Seborrheic dermatitis 03/20/2020  . Well adult exam 11/15/2019  . Mid back pain 11/15/2019  . Rash 11/15/2019  . Acute bronchitis 11/08/2019  . Gastroesophageal reflux disease 09/23/2017  . Precordial chest pain 09/23/2017  . Loose stools 09/23/2017  . Fecal urgency 09/23/2017  . Environmental allergies 09/23/2017  . Eustachian tube dysfunction, left 09/23/2017  . Major depression 02/18/2017  . Post-nasal drainage 02/18/2017  . Generalized abdominal pain 02/18/2017  . Dyspepsia 02/18/2017  . Class 2 obesity due to excess calories without serious comorbidity with body mass index (BMI) of 35.0 to 35.9 in adult 02/18/2017    Mi Balla Rober Minion PT, MPH  08/09/2020, 11:59 AM  [facility] [address] [place] [address] [place], [place], [address] Phone: [phone]   Fax:  [phone]  Name: Joel Ferguson MRN: [id number] Date of Birth: Feb 28, 1996

## 2020-08-13 ENCOUNTER — Encounter: Payer: Self-pay | Admitting: Rehabilitative and Restorative Service Providers"

## 2020-08-13 ENCOUNTER — Other Ambulatory Visit: Payer: Self-pay

## 2020-08-13 ENCOUNTER — Ambulatory Visit (INDEPENDENT_AMBULATORY_CARE_PROVIDER_SITE_OTHER): Payer: 59 | Admitting: Rehabilitative and Restorative Service Providers"

## 2020-08-13 DIAGNOSIS — M546 Pain in thoracic spine: Secondary | ICD-10-CM

## 2020-08-13 DIAGNOSIS — R29898 Other symptoms and signs involving the musculoskeletal system: Secondary | ICD-10-CM

## 2020-08-13 DIAGNOSIS — R293 Abnormal posture: Secondary | ICD-10-CM

## 2020-08-13 NOTE — Therapy (Signed)
[facility] [place] [address] [place], [place], [address] Phone: [phone]   Fax:  [phone]  Physical Therapy Treatment  Patient Details  Name: Joel Ferguson MRN: [id number] Date of Birth: 09-Mar-1996 Referring Provider (PT): Dr [name]   Encounter Date: 08/13/2020   PT End of Session - 08/13/20 1351    Visit Number 4    Number of Visits 12    Date for PT Re-Evaluation 09/04/20    PT Start Time 1347    PT Stop Time 1433    PT Time Calculation (min) 46 min    Activity Tolerance Patient tolerated treatment well           Past Medical History:  Diagnosis Date  . Abscess of knee   . Obesity     Past Surgical History:  Procedure Laterality Date  . WISDOM TOOTH EXTRACTION      There were no vitals filed for this visit.   Subjective Assessment - 08/13/20 1352    Subjective Maybe a little better. Patient has not been sleeping well. He has less frequent pain and duration is increased but intensity seems to be greater. Could not tell a difference in DN.    Currently in Pain? No/denies    Pain Score 0-No pain              [facility] PT Assessment - 08/13/20 0001      Assessment   Medical Diagnosis Midback pain    Referring Provider (PT) Dr [name]    Onset Date/Surgical Date 04/14/17    Hand Dominance Right    Next MD Visit 6 wks after PT    Prior Therapy none      Palpation   Spinal mobility hypomobile thoracolumbar junction and mid thoracic spine with PA mobs and Rt lateral mobs    Palpation comment palpable tightness through Rt > Lt thoraclumbar paraspinals bilat; lower traps Rt      Special Tests   Other special tests (+) neural tension test Rt > Lt                         [facility] Adult PT Treatment/Exercise - 08/13/20 0001      Shoulder Exercises: Supine   Other Supine Exercises lying on coregeous ball T12/L1 area 1-2 min x 2 reps    Other Supine Exercises neural mobilization 1  reps each UE ~ 45-60 sec      Shoulder Exercises: Prone   Other Prone Exercises counter plank on extended arms and then on forearms due to wrist and hand pain 30-45 sec x 3 reps    Other Prone Exercises counter plank with hip ext x 5 reps 3 sec hold each LE; thoracic rotation 5 reps with 2 sec hold each UE      Shoulder Exercises: Stretch   Other Shoulder Stretches doorway stretch 3 positions 30 sec hold x 2 reps each position; shoulder flexion hands overhead in doorway 30 sec x 2 reps    Other Shoulder Stretches thoracic extension over chair 10 sec x 3 reps; thoracic rotation holding chair and with UE's flexed overhead 10 sec x 3 reps      Manual Therapy   Joint Mobilization thoracolumbar PA and lateral mobs Grade II/III    Soft tissue mobilization deep tissue work thoracolumbar paraspinals    Myofascial Release thoracic spine    Passive ROM trunk rotation with overpressure in sitting and in Rt/Lt sidelying  PT Education - 08/13/20 1408    Education Details HEP    Person(s) Educated Patient    Methods Explanation;Demonstration;Tactile cues;Verbal cues;Handout    Comprehension Verbalized understanding;Returned demonstration;Verbal cues required;Tactile cues required               PT Long Term Goals - 07/24/20 1213      PT LONG TERM GOAL #1   Title Improve postue and alignment with patient to demonstrate improved upright posture with posterior shoulder girdle musculature engaged    Time 6    Period Weeks    Status New    Target Date 09/04/20      PT LONG TERM GOAL #2   Title Decrease pain to ,/= 0/10 to 1/10 for all functional activities    Time 6    Period Weeks    Status New    Target Date 09/04/20      PT LONG TERM GOAL #3   Title Patient to verbalize and demonstrate correct sitting posture and alignment for desk/computer work as well as functional activities such as lifting his 25 yr old son and working in the kitchen    Time 6    Period  Weeks    Status New    Target Date 09/04/20      PT LONG TERM GOAL #4   Title Independent in HEP    Time 6    Period Weeks    Status New    Target Date 09/04/20      PT LONG TERM GOAL #5   Title Improve functional limitation score to 59    Time 6    Period Weeks    Status New    Target Date 09/04/20                 Plan - 08/13/20 1357    Clinical Impression Statement Some improvement in symptoms with patient reporting decresaed frequecy and duration of pain. He can move and stretch when he does have pain and relieve symptoms in a shorter period of time with stretching. Discussed stopping activities to stretch before pain occurs.    Rehab Potential Good    PT Frequency 2x / week    PT Duration 6 weeks    PT Treatment/Interventions ADLs/Self Care Home Management;Aquatic Therapy;Cryotherapy;Electrical Stimulation;Iontophoresis 4mg /ml Dexamethasone;Moist Heat;Ultrasound;Functional mobility training;Therapeutic activities;Therapeutic exercise;Neuromuscular re-education;Patient/family education;Manual techniques;Dry needling;Passive range of motion;Taping    PT Next Visit Plan review HEP; review ergonomics for work station at computer as needed; thoracic mobs prone and sitting; review and progress strengthening for posterior shoulder girdle (prone series); modalities as indicated; neuromuscular re-education; trial of taping; another trial of DN and continue as indicated(no significant improvement following initial trial) - add diagonal TB - opening book    PT Home Exercise Plan [email or web address]    Consulted and Agree with Plan of Care Patient           Patient will benefit from skilled therapeutic intervention in order to improve the following deficits and impairments:     Visit Diagnosis: Pain in thoracic spine  Other symptoms and signs involving the musculoskeletal system  Abnormal posture     Problem List Patient Active Problem List   Diagnosis Date Noted  . Eczema  07/18/2020  . Daytime somnolence 03/20/2020  . Seborrheic dermatitis 03/20/2020  . Well adult exam 11/15/2019  . Mid back pain 11/15/2019  . Rash 11/15/2019  . Acute bronchitis 11/08/2019  . Gastroesophageal reflux disease 09/23/2017  . Precordial chest pain 09/23/2017  .  Loose stools 09/23/2017  . Fecal urgency 09/23/2017  . Environmental allergies 09/23/2017  . Eustachian tube dysfunction, left 09/23/2017  . Major depression 02/18/2017  . Post-nasal drainage 02/18/2017  . Generalized abdominal pain 02/18/2017  . Dyspepsia 02/18/2017  . Class 2 obesity due to excess calories without serious comorbidity with body mass index (BMI) of 35.0 to 35.9 in adult 02/18/2017    Kinesha Auten Rober Minion PT, MPH  08/13/2020, 2:38 PM  [facility] [address] [place], [place], [address] Phone: [phone]   Fax:  [phone]  Name: Onur Mori MRN: [id number] Date of Birth: 1995/08/24

## 2020-08-13 NOTE — Patient Instructions (Signed)
Access Code: [email or web address]: [email or web address]: 05/02/2022Prepared by: Carols Clemence HoltExercises  Prone Press Up - 2 x daily - 7 x weekly - 1 sets - 10 reps - 2-3 sec hold  Prone Press Up On Elbows - 2 x daily - 7 x weekly - 1 sets - 3 reps - 30 sec hold  Doorway Pec Stretch at 60 Degrees Abduction - 3 x daily - 7 x weekly - 3 reps - 1 sets  Doorway Pec Stretch at 90 Degrees Abduction - 3 x daily - 7 x weekly - 3 reps - 1 sets - 30 seconds hold  Doorway Pec Stretch at 120 Degrees Abduction - 3 x daily - 7 x weekly - 3 reps - 1 sets - 30 second hold hold  Standing Shoulder External Rotation with Resistance - 2 x daily - 7 x weekly - 1-3 sets - 10 reps - 2-3 sec hold  Standing Bilateral Low Shoulder Row with Anchored Resistance - 2 x daily - 7 x weekly - 1-3 sets - 10 reps - 2-3 sec hold  Shoulder Extension with Resistance - 2 x daily - 7 x weekly - 1-3 sets - 10 reps - 2-3 sec hold  Standing High Row with Resistance - 2 x daily - 7 x weekly - 1 sets - 10 reps - 3-5 sec hold  Prone Shoulder Extension - 2 x daily - 7 x weekly - 1 sets - 10 reps - 3-5 sec hold  Cat-Camel - 2 x daily - 7 x weekly - 1 sets - 5-10 reps - 3-5 sec hold  Seated Thoracic Lumbar Extension - 2 x daily - 7 x weekly - 1 sets - 3 reps - 10-15 sec hold  Seated Thoracic Flexion and Rotation with Arms Crossed - 2 x daily - 7 x weekly - 1 sets - 3 reps - 30 sec hold  Seated Thoracic Extension and Rotation with Reach - 2 x daily - 7 x weekly - 1 sets - 3 reps - 10 sec hold  Plank with Elbows on Table - 2 x daily - 7 x weekly - 1 sets - 3 reps - 30-60 sec hold  Plank with Thoracic Rotation on Counter - 2 x daily - 7 x weekly - 1 sets - 10 reps - 2-3 sec hold  Plank with Hip Extension on Counter - 2 x daily - 7 x weekly - 1 sets - 5-10 reps - 3-5 hold

## 2020-08-15 ENCOUNTER — Ambulatory Visit (INDEPENDENT_AMBULATORY_CARE_PROVIDER_SITE_OTHER): Payer: 59 | Admitting: Rehabilitative and Restorative Service Providers"

## 2020-08-15 ENCOUNTER — Other Ambulatory Visit: Payer: Self-pay

## 2020-08-15 ENCOUNTER — Encounter: Payer: Self-pay | Admitting: Rehabilitative and Restorative Service Providers"

## 2020-08-15 DIAGNOSIS — R29898 Other symptoms and signs involving the musculoskeletal system: Secondary | ICD-10-CM

## 2020-08-15 DIAGNOSIS — R293 Abnormal posture: Secondary | ICD-10-CM

## 2020-08-15 DIAGNOSIS — M546 Pain in thoracic spine: Secondary | ICD-10-CM | POA: Diagnosis not present

## 2020-08-15 NOTE — Patient Instructions (Signed)
Access Code: [email or web address]: [email or web address]: 05/04/2022Prepared by: Alrick Cubbage HoltExercises  Prone Press Up - 2 x daily - 7 x weekly - 1 sets - 10 reps - 2-3 sec hold  Prone Press Up On Elbows - 2 x daily - 7 x weekly - 1 sets - 3 reps - 30 sec hold  Doorway Pec Stretch at 60 Degrees Abduction - 3 x daily - 7 x weekly - 3 reps - 1 sets  Doorway Pec Stretch at 90 Degrees Abduction - 3 x daily - 7 x weekly - 3 reps - 1 sets - 30 seconds hold  Doorway Pec Stretch at 120 Degrees Abduction - 3 x daily - 7 x weekly - 3 reps - 1 sets - 30 second hold hold  Standing Shoulder External Rotation with Resistance - 2 x daily - 7 x weekly - 1-3 sets - 10 reps - 2-3 sec hold  Standing Bilateral Low Shoulder Row with Anchored Resistance - 2 x daily - 7 x weekly - 1-3 sets - 10 reps - 2-3 sec hold  Shoulder Extension with Resistance - 2 x daily - 7 x weekly - 1-3 sets - 10 reps - 2-3 sec hold  Standing High Row with Resistance - 2 x daily - 7 x weekly - 1 sets - 10 reps - 3-5 sec hold  Prone Shoulder Extension - 2 x daily - 7 x weekly - 1 sets - 10 reps - 3-5 sec hold  Cat-Camel - 2 x daily - 7 x weekly - 1 sets - 5-10 reps - 3-5 sec hold  Seated Thoracic Lumbar Extension - 2 x daily - 7 x weekly - 1 sets - 3 reps - 10-15 sec hold  Seated Thoracic Flexion and Rotation with Arms Crossed - 2 x daily - 7 x weekly - 1 sets - 3 reps - 30 sec hold  Seated Thoracic Extension and Rotation with Reach - 2 x daily - 7 x weekly - 1 sets - 3 reps - 10 sec hold  Plank with Elbows on Table - 2 x daily - 7 x weekly - 1 sets - 3 reps - 30-60 sec hold  Plank with Thoracic Rotation on Counter - 2 x daily - 7 x weekly - 1 sets - 10 reps - 2-3 sec hold  Plank with Hip Extension on Counter - 2 x daily - 7 x weekly - 1 sets - 5-10 reps - 3-5 hold  Prone Chest Stretch on Chair - 1 x daily - 7 x weekly - 1 sets - 3 reps - 60 sec hold  Thoracic Extension Mobilization with Noodle - 1 x daily - 7 x weekly - 1 sets - 3 reps  - 60 sec hold  Sidelying Open Book Thoracic Lumbar Rotation and Extension - 1 x daily - 7 x weekly - 1-2 sets - 10 reps - 3-5 sec hold  Shoulder Flexion Serratus Activation with Resistance - 1 x daily - 7 x weekly - 1 sets - 10 reps - 3-5 sec hold  Wall Clock with Theraband - 2 x daily - 7 x weekly - 1 sets - 10 reps - 3 sec hold  Wall Clock with Theraband - 2 x daily - 7 x weekly - 1 sets - 10 reps - 3 sec hold

## 2020-08-15 NOTE — Therapy (Addendum)
[place] [address] [place], [place], [address] Phone: [phone]   Fax:  [phone]  Physical Therapy Treatment  Patient Details  Name: Rameses Ou MRN: [id number] Date of Birth: 30-Jul-1995 Referring Provider (PT): Dr Luetta Nutting   Encounter Date: 08/15/2020   PT End of Session - 08/15/20 1519     Visit Number 5    Number of Visits 12    Date for PT Re-Evaluation 09/04/20    PT Start Time 9784    PT Stop Time 1600    PT Time Calculation (min) 44 min    Activity Tolerance Patient tolerated treatment well             Past Medical History:  Diagnosis Date   Abscess of knee    Obesity     Past Surgical History:  Procedure Laterality Date   WISDOM TOOTH EXTRACTION      There were no vitals filed for this visit.   Subjective Assessment - 08/15/20 1519     Subjective Sore through the abs each side - distracts noticing thoracic pain. Has tried to get up before pain starts    Currently in Pain? No/denies    Pain Score 0-No pain                               [facility] Adult PT Treatment/Exercise - 08/15/20 0001       Therapeutic Activites    Therapeutic Activities Other Therapeutic Activities    Other Therapeutic Activities stretch over foam roll with 5 then 10 # wt; instruction and trial of myofacial release with various tools in supine      Shoulder Exercises: Sidelying   Other Sidelying Exercises open book with green TB x 10 each side      Shoulder Exercises: Standing   Other Standing Exercises activation of serratus standing red TB x 10; thoracic clock x 4 reps for HEP instruction      Shoulder Exercises: Stretch   Table Stretch -Flexion Limitations prayer stretch UE supported on table pt in kneeling 30-60 sec x 3 reps    Other Shoulder Stretches doorway stretch 3 positions 30 sec hold x 2 reps each position; shoulder flexion hands overhead in doorway 30 sec x 2 reps     Other Shoulder Stretches thoracic extension over chair 10 sec x 3 reps; thoracic rotation holding chair and with UE's flexed overhead 10 sec x 3 reps    External Rotation Stretch Limitations thoracic clock standing at wall bilat x 3 each side      Manual Therapy   Joint Mobilization thoracolumbar PA and lateral mobs Grade II/III    Soft tissue mobilization deep tissue work thoracolumbar paraspinals    Myofascial Release thoracic spine    Passive ROM trunk rotation with overpressure in sitting and in Rt/Lt sidelying      [employer]   Facilitate Muscle  1 strip along bilateral thoracic paraspinals to encourage thoracic extension                    PT Education - 08/15/20 1551     Education Details HEP    Person(s) Educated Patient    Methods Explanation;Demonstration;Tactile cues;Verbal cues    Comprehension Verbalized understanding;Returned demonstration;Verbal cues required;Tactile cues required                 PT Long Term Goals - 07/24/20 1213  PT LONG TERM GOAL #1   Title Improve postue and alignment with patient to demonstrate improved upright posture with posterior shoulder girdle musculature engaged    Time 6    Period Weeks    Status New    Target Date 09/04/20      PT LONG TERM GOAL #2   Title Decrease pain to ,/= 0/10 to 1/10 for all functional activities    Time 6    Period Weeks    Status New    Target Date 09/04/20      PT LONG TERM GOAL #3   Title Patient to verbalize and demonstrate correct sitting posture and alignment for desk/computer work as well as functional activities such as lifting his 21 yr old son and working in the kitchen    Time 6    Period Weeks    Status New    Target Date 09/04/20      PT LONG TERM GOAL #4   Title Independent in HEP    Time 6    Period Weeks    Status New    Target Date 09/04/20      PT LONG TERM GOAL #5   Title Improve functional limitation score to 59    Time 6    Period Weeks    Status  New    Target Date 09/04/20                   Plan - 08/15/20 1524     Clinical Impression Statement Some soreness through abs from rotational stretch for thoracic. Added stretching and strengthening for thoracic spine. Trial of taping to engourage thoracic extension.    Rehab Potential Good    PT Frequency 2x / week    PT Duration 6 weeks    PT Treatment/Interventions ADLs/Self Care Home Management;Aquatic Therapy;Cryotherapy;Electrical Stimulation;Iontophoresis 81m/ml Dexamethasone;Moist Heat;Ultrasound;Functional mobility training;Therapeutic activities;Therapeutic exercise;Neuromuscular re-education;Patient/family education;Manual techniques;Dry needling;Passive range of motion;Taping    PT Next Visit Plan review HEP; review ergonomics for work station at computer as needed; thoracic mobs prone and sitting; review and progress strengthening for posterior shoulder girdle (prone series); modalities as indicated; neuromuscular re-education; assess trial of taping; another trial of DN and continue as indicated(no significant improvement following initial trial)    PT Home Exercise Plan [email or web address]    Consulted and Agree with Plan of Care Patient             Patient will benefit from skilled therapeutic intervention in order to improve the following deficits and impairments:     Visit Diagnosis: Pain in thoracic spine  Other symptoms and signs involving the musculoskeletal system  Abnormal posture     Problem List Patient Active Problem List   Diagnosis Date Noted   Eczema 07/18/2020   Daytime somnolence 03/20/2020   Seborrheic dermatitis 03/20/2020   Well adult exam 11/15/2019   Mid back pain 11/15/2019   Rash 11/15/2019   Acute bronchitis 11/08/2019   Gastroesophageal reflux disease 09/23/2017   Precordial chest pain 09/23/2017   Loose stools 09/23/2017   Fecal urgency 09/23/2017   Environmental allergies 09/23/2017   Eustachian tube dysfunction, left  09/23/2017   Major depression 02/18/2017   Post-nasal drainage 02/18/2017   Generalized abdominal pain 02/18/2017   Dyspepsia 02/18/2017   Class 2 obesity due to excess calories without serious comorbidity with body mass index (BMI) of 35.0 to 35.9 in adult 02/18/2017    CRio del MarPT, MPH  08/15/2020, 4:57 PM  [facility] [place] [address] [place], [place], [address] Phone: [phone]   Fax:  [phone]  Name: Nesbit Michon MRN: [id number] Date of Birth: 11-29-1995  PHYSICAL THERAPY DISCHARGE SUMMARY  Visits from Start of Care: 5  Current functional level related to goals / functional outcomes: See last progress note for discharge status   Remaining deficits: Unknown    Education / Equipment: HEP    Patient agrees to discharge. Patient goals were partially met. Patient is being discharged due to not returning since the last visit.  Dmitri Pettigrew P. Helene Kelp PT, MPH 10/09/20 11:54 AM

## 2020-08-22 ENCOUNTER — Encounter: Payer: 59 | Admitting: Physical Therapy

## 2020-08-23 ENCOUNTER — Ambulatory Visit (INDEPENDENT_AMBULATORY_CARE_PROVIDER_SITE_OTHER): Payer: 59 | Admitting: Professional

## 2020-08-23 DIAGNOSIS — F331 Major depressive disorder, recurrent, moderate: Secondary | ICD-10-CM

## 2020-08-23 DIAGNOSIS — F411 Generalized anxiety disorder: Secondary | ICD-10-CM | POA: Diagnosis not present

## 2020-09-03 ENCOUNTER — Ambulatory Visit (INDEPENDENT_AMBULATORY_CARE_PROVIDER_SITE_OTHER): Payer: 59 | Admitting: Professional

## 2020-09-03 DIAGNOSIS — F411 Generalized anxiety disorder: Secondary | ICD-10-CM | POA: Diagnosis not present

## 2020-09-03 DIAGNOSIS — F331 Major depressive disorder, recurrent, moderate: Secondary | ICD-10-CM | POA: Diagnosis not present

## 2020-09-24 ENCOUNTER — Ambulatory Visit (INDEPENDENT_AMBULATORY_CARE_PROVIDER_SITE_OTHER): Payer: 59 | Admitting: Professional

## 2020-09-24 DIAGNOSIS — F331 Major depressive disorder, recurrent, moderate: Secondary | ICD-10-CM | POA: Diagnosis not present

## 2020-09-24 DIAGNOSIS — F411 Generalized anxiety disorder: Secondary | ICD-10-CM

## 2020-09-26 ENCOUNTER — Ambulatory Visit: Payer: 59 | Admitting: Professional

## 2020-10-03 ENCOUNTER — Ambulatory Visit (INDEPENDENT_AMBULATORY_CARE_PROVIDER_SITE_OTHER): Payer: 59 | Admitting: Professional

## 2020-10-03 DIAGNOSIS — F411 Generalized anxiety disorder: Secondary | ICD-10-CM

## 2020-10-03 DIAGNOSIS — F331 Major depressive disorder, recurrent, moderate: Secondary | ICD-10-CM | POA: Diagnosis not present

## 2020-10-08 ENCOUNTER — Ambulatory Visit (INDEPENDENT_AMBULATORY_CARE_PROVIDER_SITE_OTHER): Payer: 59 | Admitting: Professional

## 2020-10-08 DIAGNOSIS — F331 Major depressive disorder, recurrent, moderate: Secondary | ICD-10-CM | POA: Diagnosis not present

## 2020-10-08 DIAGNOSIS — F411 Generalized anxiety disorder: Secondary | ICD-10-CM | POA: Diagnosis not present

## 2020-10-17 IMAGING — DX DG THORACIC SPINE 3V
3 series · 3 of 3 positions shown · non-contrast
Comparison: None.

CLINICAL DATA: Pain

EXAM:
THORACIC SPINE - 3 VIEWS

[t-spine ap]
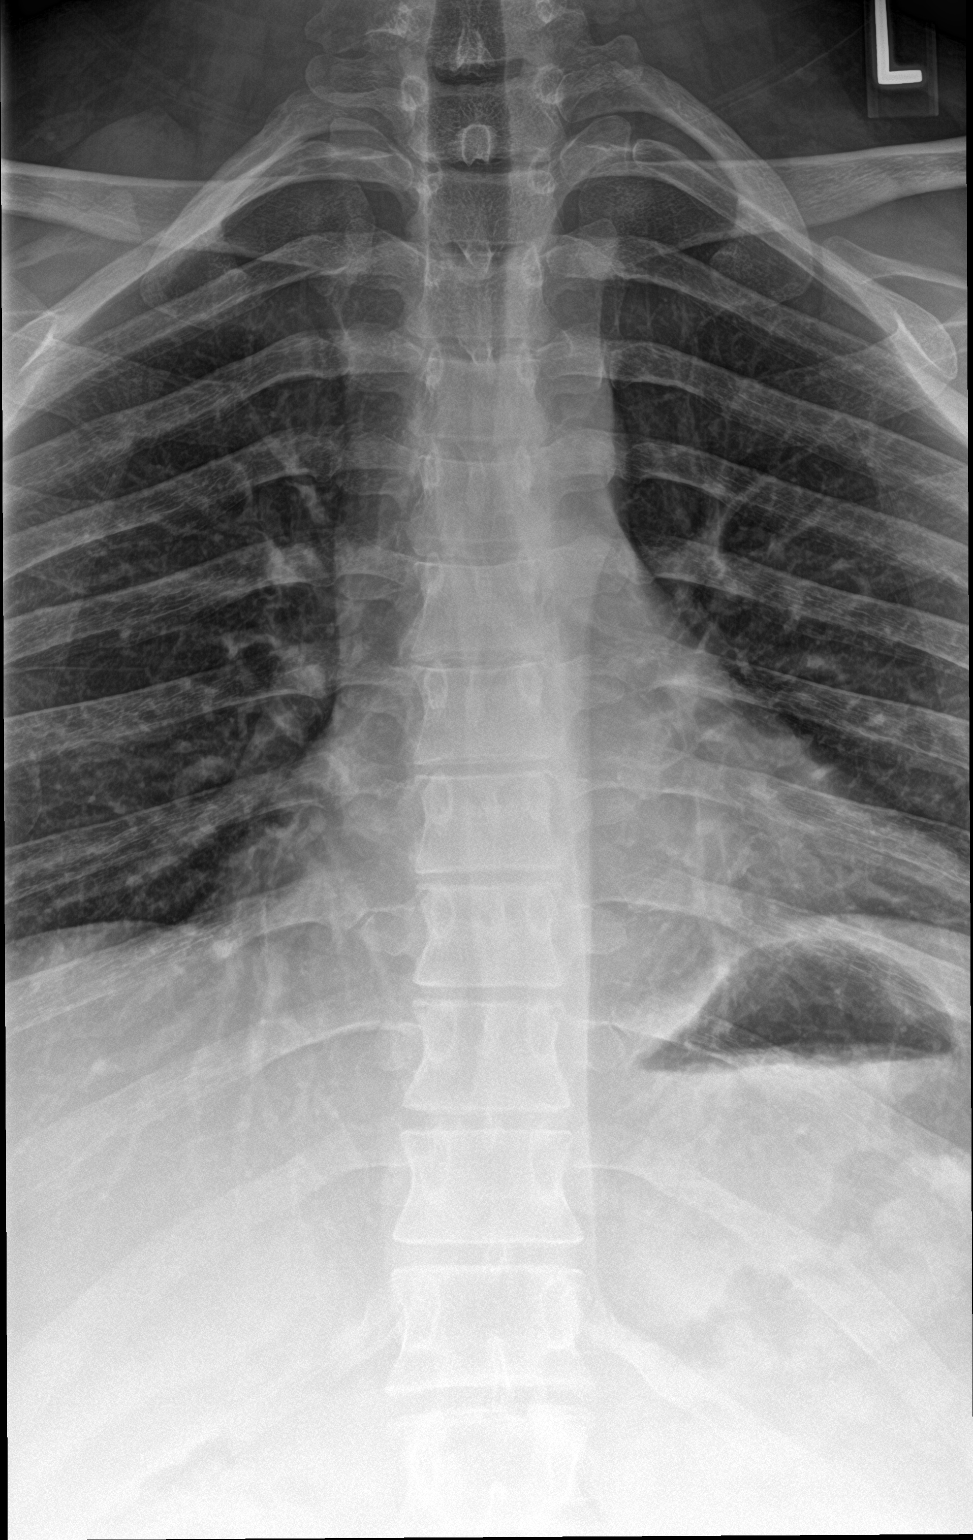

[t-spine lat]
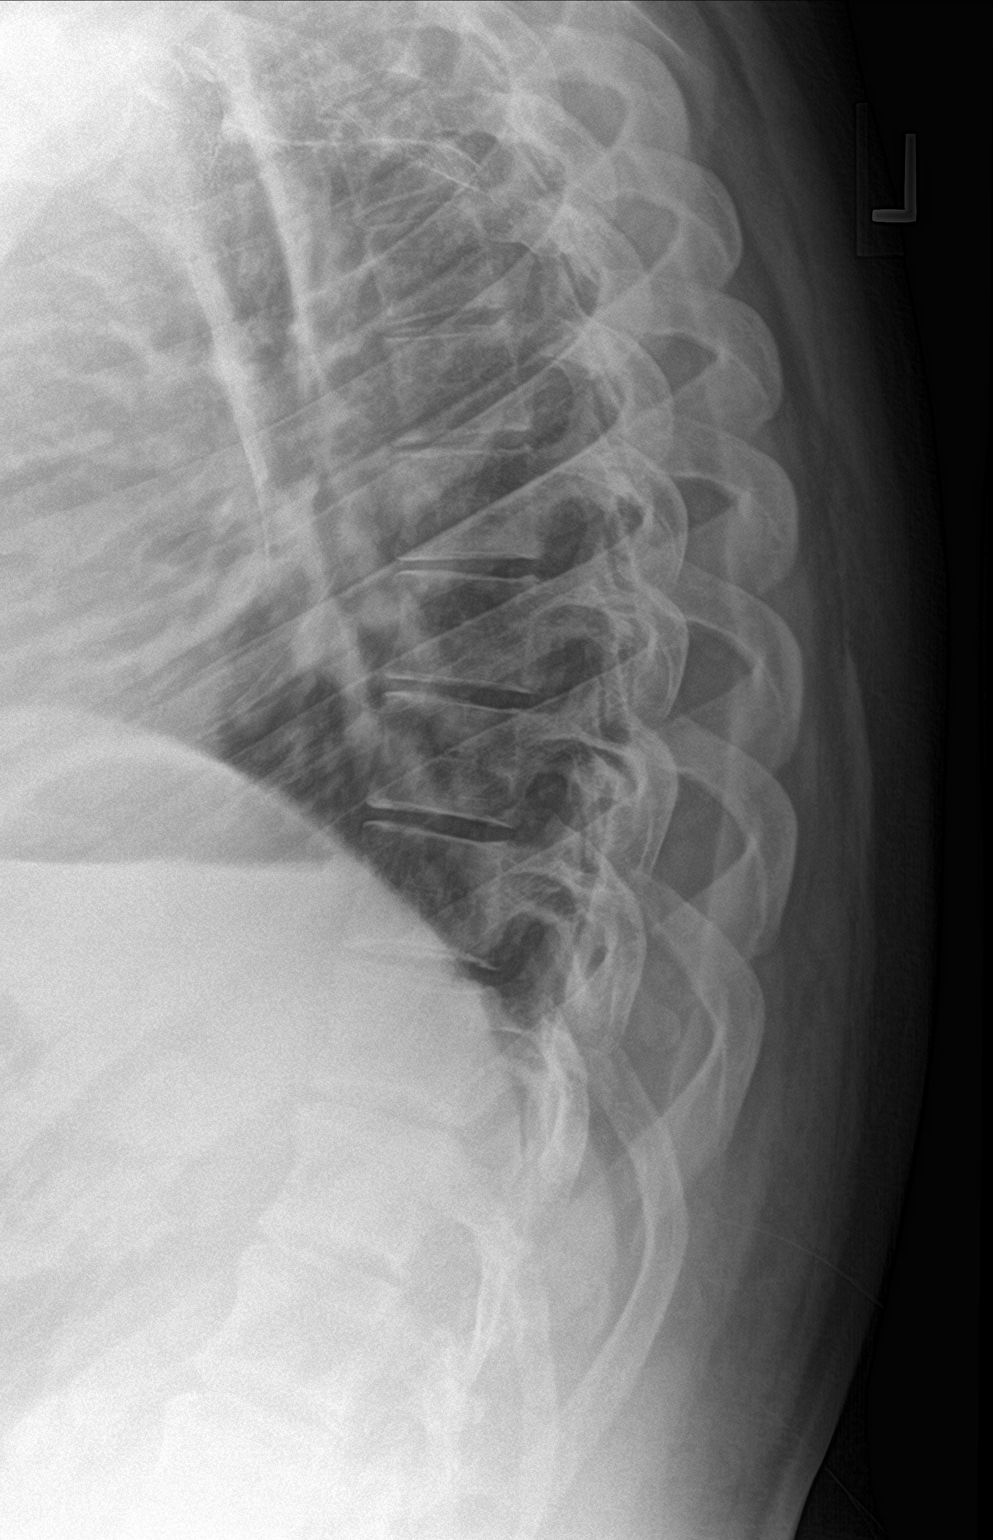

[t-spine swimmers]
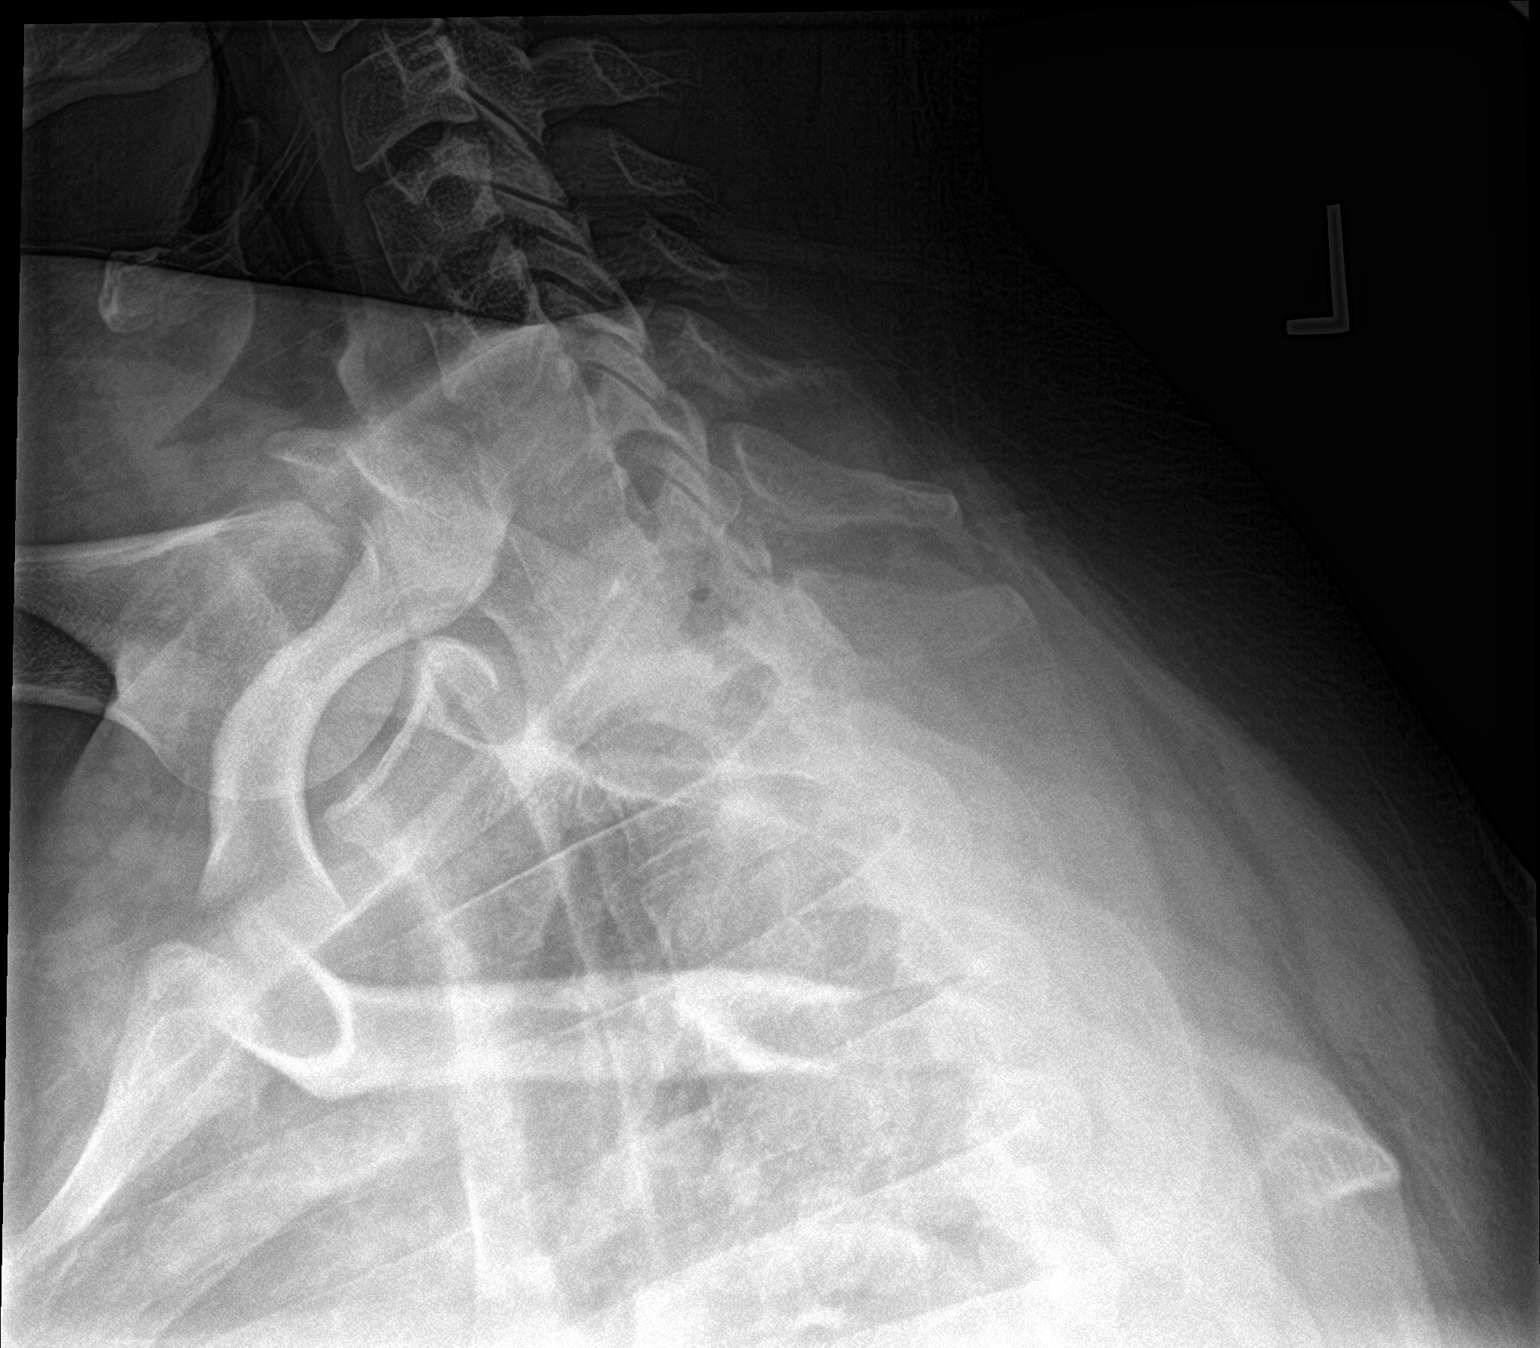

[3 of 3 positions shown; findings below may reference images not displayed]

FINDINGS: There is no evidence of thoracic spine fracture. Alignment is
normal. No other significant bone abnormalities are identified.
IMPRESSION: Negative.

## 2020-10-18 ENCOUNTER — Ambulatory Visit (INDEPENDENT_AMBULATORY_CARE_PROVIDER_SITE_OTHER): Payer: 59 | Admitting: Professional

## 2020-10-18 DIAGNOSIS — F411 Generalized anxiety disorder: Secondary | ICD-10-CM | POA: Diagnosis not present

## 2020-10-18 DIAGNOSIS — F331 Major depressive disorder, recurrent, moderate: Secondary | ICD-10-CM

## 2020-10-22 ENCOUNTER — Ambulatory Visit: Payer: 59 | Admitting: Professional

## 2020-11-05 ENCOUNTER — Ambulatory Visit (INDEPENDENT_AMBULATORY_CARE_PROVIDER_SITE_OTHER): Payer: 59 | Admitting: Professional

## 2020-11-05 DIAGNOSIS — F411 Generalized anxiety disorder: Secondary | ICD-10-CM

## 2020-11-05 DIAGNOSIS — F331 Major depressive disorder, recurrent, moderate: Secondary | ICD-10-CM

## 2020-11-19 ENCOUNTER — Ambulatory Visit (INDEPENDENT_AMBULATORY_CARE_PROVIDER_SITE_OTHER): Payer: 59 | Admitting: Professional

## 2020-11-19 DIAGNOSIS — F331 Major depressive disorder, recurrent, moderate: Secondary | ICD-10-CM

## 2020-11-19 DIAGNOSIS — F411 Generalized anxiety disorder: Secondary | ICD-10-CM

## 2020-11-28 ENCOUNTER — Ambulatory Visit (INDEPENDENT_AMBULATORY_CARE_PROVIDER_SITE_OTHER): Payer: 59 | Admitting: Professional

## 2020-11-28 DIAGNOSIS — F411 Generalized anxiety disorder: Secondary | ICD-10-CM

## 2020-11-28 DIAGNOSIS — F331 Major depressive disorder, recurrent, moderate: Secondary | ICD-10-CM

## 2020-12-03 ENCOUNTER — Ambulatory Visit: Payer: 59 | Admitting: Professional

## 2020-12-10 ENCOUNTER — Ambulatory Visit (INDEPENDENT_AMBULATORY_CARE_PROVIDER_SITE_OTHER): Payer: 59 | Admitting: Professional

## 2020-12-10 DIAGNOSIS — F411 Generalized anxiety disorder: Secondary | ICD-10-CM

## 2020-12-10 DIAGNOSIS — F331 Major depressive disorder, recurrent, moderate: Secondary | ICD-10-CM | POA: Diagnosis not present

## 2020-12-24 ENCOUNTER — Ambulatory Visit: Payer: 59 | Admitting: Professional

## 2020-12-31 ENCOUNTER — Ambulatory Visit (INDEPENDENT_AMBULATORY_CARE_PROVIDER_SITE_OTHER): Payer: 59 | Admitting: Professional

## 2020-12-31 DIAGNOSIS — F411 Generalized anxiety disorder: Secondary | ICD-10-CM

## 2020-12-31 DIAGNOSIS — F331 Major depressive disorder, recurrent, moderate: Secondary | ICD-10-CM

## 2021-01-21 ENCOUNTER — Ambulatory Visit: Payer: 59 | Admitting: Professional

## 2021-01-28 ENCOUNTER — Ambulatory Visit (INDEPENDENT_AMBULATORY_CARE_PROVIDER_SITE_OTHER): Payer: 59 | Admitting: Professional

## 2021-01-28 DIAGNOSIS — F331 Major depressive disorder, recurrent, moderate: Secondary | ICD-10-CM

## 2021-01-28 DIAGNOSIS — F411 Generalized anxiety disorder: Secondary | ICD-10-CM

## 2021-02-11 ENCOUNTER — Ambulatory Visit: Payer: 59 | Admitting: Professional

## 2021-02-25 ENCOUNTER — Ambulatory Visit (INDEPENDENT_AMBULATORY_CARE_PROVIDER_SITE_OTHER): Payer: 59 | Admitting: Professional

## 2021-02-25 DIAGNOSIS — F411 Generalized anxiety disorder: Secondary | ICD-10-CM | POA: Diagnosis not present

## 2021-02-25 DIAGNOSIS — F331 Major depressive disorder, recurrent, moderate: Secondary | ICD-10-CM

## 2021-03-11 ENCOUNTER — Ambulatory Visit: Payer: 59 | Admitting: Professional

## 2021-05-03 ENCOUNTER — Ambulatory Visit (INDEPENDENT_AMBULATORY_CARE_PROVIDER_SITE_OTHER): Payer: 59 | Admitting: Professional

## 2021-05-03 ENCOUNTER — Encounter: Payer: Self-pay | Admitting: Professional

## 2021-05-03 DIAGNOSIS — F331 Major depressive disorder, recurrent, moderate: Secondary | ICD-10-CM

## 2021-05-03 DIAGNOSIS — F411 Generalized anxiety disorder: Secondary | ICD-10-CM

## 2021-05-03 NOTE — Progress Notes (Signed)
° ° ° ° ° ° ° ° ° ° ° ° ° ° °  Joel Ferguson, LCMHC °
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
## 2021-05-14 ENCOUNTER — Encounter: Payer: Self-pay | Admitting: Professional

## 2021-05-14 ENCOUNTER — Ambulatory Visit (INDEPENDENT_AMBULATORY_CARE_PROVIDER_SITE_OTHER): Payer: 59 | Admitting: Professional

## 2021-05-14 DIAGNOSIS — F411 Generalized anxiety disorder: Secondary | ICD-10-CM | POA: Diagnosis not present

## 2021-05-14 DIAGNOSIS — F331 Major depressive disorder, recurrent, moderate: Secondary | ICD-10-CM

## 2021-05-14 NOTE — Progress Notes (Signed)
[facility] Counselor/Therapist Progress Note  Patient ID: Joel Ferguson, MRN: [id number],    Date: 05/14/2021  Time Spent: 51 minutes [id number] am Late because of having to drip off [name] at school  Treatment Type: Individual Therapy  Reported Symptoms: tired, physically sick, neutral mood  Mental Status Exam: Appearance:  Casual     Behavior: Appropriate and Sharing  Motor: Normal  Speech/Language:  Clear and Coherent and Normal Rate  Affect: Full Range  Mood: normal  Thought process: goal directed  Thought content:   WNL  Sensory/Perceptual disturbances:   WNL  Orientation: oriented to person, place, time/date, and situation  Attention: Good  Concentration: Good  Memory: WNL  Fund of knowledge:  Good  Insight:   Good  Judgment:  Good  Impulse Control: Good   Risk Assessment: Danger to Self:  No Self-injurious Behavior: No Danger to Others: No Duty to Warn:no Physical Aggression / Violence:No  Access to Firearms a concern: No  Gang Involvement:No   Subjective: This session was held via video teletherapy due to the coronavirus risk at this time. The patient consented to video teletherapy and was located at his home during this session. He is aware it is the responsibility of the patient to secure confidentiality on his end of the session. The provider was in a private home office for the duration of this session.   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, [employer]ick 4-marital -daily is complaining about her job -she was less critical when they were first married -she is masking her autism   -she is exhausted with wearing the mask   -she needs some stim objects to work -he talked about faith   -he is Emergency planning/management officeratheist and [name] is agnostic   -[name]'s employer plays [employer] all the time     -it makes her angry     -she does not talk about her issues with her employer -he suggested they spend some time together while [name] is in aftercare   -she did not appear interested and he backed off   -discussed importance of him continuing to proceed so they have the opportunities to have fun   -she is quick to feel he is yelling at her -Clinician suggested that [name] may be responding from an autism perspective and not a trauma perspective   -pt admits it makes sense  Problems Addressed  Attention Deficit Disorder (ADD) - Adult, Low Self-Esteem, Social Anxiety, Unipolar Depression  Goals 1. Accept ADD as a chronic issue and need for continuing medication treatment. 2. Achieve a satisfactory level of balance, structure, and intimacy in personal life. Objective Increase knowledge of ADHD and its treatment. Target Date: 2022-02-10 Frequency: Monthly  Progress: 30 Modality: individual  Objective Acknowledge procrastination and the need to reduce it. Target Date: 2022-02-10 Frequency: Monthly  Progress: 10 Modality: individual  Objective Identify the current specific ADD behaviors that cause the most  difficulty. Target Date: 2022-02-10 Frequency: Monthly  Progress: 0 Modality: individual  Objective Implement relaxation procedures to reduce tension and physical restlessness. Target Date: 2022-02-10 Frequency: Monthly  Progress: 40 Modality: individual  Objective Invite a significant other to join in the therapy to provide support throughout therapy. Target Date: 2022-02-10 Frequency: Monthly  Progress: 20 Modality: individual   Objective Learn and implement skills to reduce the disruptive influence of distractibility. Target Date: 2022-02-10 Frequency: Monthly  Progress: 0 Modality: individual  Objective Learn and implement skills to reduce procrastination. Target Date: 2022-02-10 Frequency: Monthly  Progress: 10 Modality: individual  3. Alleviate depressive symptoms and return to previous level of effective functioning. 4. Demonstrate improved self-esteem through more pride in appearance, more assertiveness, greater eye contact, and identification of positive traits in self-talk messages. 5. Develop a consistent, positive self-image. 6. Develop healthy interpersonal relationships that lead to the alleviation and help prevent the relapse of depression. Objective Learn and implement relapse prevention skills. Target Date: 2022-02-10 Frequency: Monthly  Progress: 0 Modality: individual  Related Interventions Discuss with the client the distinction between a lapse and relapse, associating a lapse with a rather common, temporary setback that may involve, for example, re-experiencing a depressive thought and/or urge to withdraw or avoid (perhaps as related to some loss or conflict) and a relapse as a sustained return to a pattern of depressive thinking and feeling usually accompanied by interpersonal withdrawal and/or avoidance. Objective Identify and replace thoughts and beliefs that support depression. Target Date: 2022-02-10 Frequency: Monthly  Progress: 40 Modality: individual  Related Interventions Explore and restructure underlying assumptions and beliefs reflected in biased self-talk that may put the client at risk for relapse or recurrence. Facilitate and reinforce the client's shift from biased depressive self-talk and beliefs to reality-based cognitive messages that enhance self-confidence and increase adaptive actions (see "Positive Self-Talk" in the Adult Psychotherapy Homework Planner by Stephannie Li). Assign  "behavioral experiments" in which depressive automatic thoughts are treated as hypotheses/prediction, reality-based alternative hypotheses/prediction are generated, and both are tested against the client's past, present, and/or future experiences. Conduct Cognitive-Behavioral Therapy (see Cognitive Behavior Therapy by Reola Calkins; Overcoming Depression by Agapito Games al.), beginning with helping the client learn the connection among cognition, depressive feelings, and actions. Objective Participate in couples therapy to decrease depression and improve the relationship. Target Date: 2022-02-10 Frequency: Monthly  Progress: 50 Modality: individual  Objective Learn and implement behavioral strategies to overcome depression. Target Date: 2022-02-10 Frequency: Monthly  Progress: 10 Modality: individual  Related Interventions Assist the client in developing skills that increase the likelihood of deriving pleasure from behavioral activation (e.g., assertiveness skills, developing an exercise plan, less internal/more external focus, increased social involvement); reinforce success. Engage the client in "behavioral activation," increasing his/her activity level and contact with sources of reward, while identifying processes that inhibit activation (see Behavioral Activation for Depression by Katharine Look, Dimidjian, and Herman-Dunn; or assign "Identify and Schedule Pleasant Activities" in the Adult Psychotherapy Homework Planner by [employer]); use behavioral techniques such as instruction, rehearsal, role-playing, role reversal, as needed, to facilitate activity in the client's daily life; reinforce success. Objective Learn and implement conflict resolution skills to resolve interpersonal problems. Target Date: 2022-02-10 Frequency: Monthly  Progress: 40 Modality: individual  Related Interventions Teach conflict resolution skills (e.g., empathy, active listening, "I messages," respectful communication, assertiveness  without aggression, compromise); use psychoeducation, modeling, role-playing, and rehearsal to work through several current conflicts; assign homework exercises; review and repeat so as to integrate their use into the client's life. 7. Develop healthy  thinking patterns and beliefs about self, others, and the world that lead to the alleviation and help prevent the relapse of depression. 8. Develop the essential social skills that will enhance the quality of relationship life. 9. Elevate self-esteem. 10. Establish an inward sense of self-worth, confidence, and competence. 11. Interact socially without undue distress or disability. Objective Decrease the frequency of negative self-descriptive statements and increase frequency of positive self-descriptive statements. Target Date: 2022-02-10 Frequency: Monthly  Progress: 30 Modality: individual  Related Interventions Assist the client in developing positive self-talk as a way of boosting his/her confidence and self-image (or assign "Positive Self-Talk" in the Adult Psychotherapy Homework Planner by Stephannie LiJongsma). Help the client reframe his/her negative assessment of himself/herself. Assist the client in becoming aware of how he/she expresses or acts out negative feelings about himself/herself. Objective Form realistic, appropriate, and attainable goals for self in all areas of life. Target Date: 2022-02-10 Frequency: Monthly  Progress: 20 Modality: individual  Related Interventions Assign the client to make a list of goals for various areas of life and a plan for steps toward goal attainment. Help the client analyze his/her goals to make sure they are realistic and attainable. Objective Identify and engage in activities that would improve self-image by being consistent with one's values. Target Date: 2022-02-10 Frequency: Monthly  Progress: 30 Modality: individual  Related Interventions Identify and assign activities congruent with the client's  values; process them toward improving self-concept and self-esteem. Help the client analyze his/her values and the congruence or incongruence between them and the client's daily activities. Objective Identify and replace negative self-talk messages used to reinforce low self-esteem. Target Date: 2022-02-10 Frequency: Monthly  Progress: 50 Modality: individual  Related Interventions Help the client identify his/her distorted, negative beliefs about self and the world and replace these messages with more realistic, affirmative messages (or assign "Journal and Replace Self-Defeating Thoughts" in the Adult Psychotherapy Homework Planner by [facility] or read What to Say When You Talk to Yourself by Helmstetter). Objective Take responsibility for daily grooming and personal hygiene. Target Date: 2022-02-10 Frequency: Monthly  Progress: 40 Modality: individual  Related Interventions Monitor and give feedback to the client on his/her grooming and hygiene. Objective Positively acknowledge verbal compliments from others. Target Date: 2022-02-10 Frequency: Monthly  Progress: 10 Modality: individual  Related Interventions Assign the client to be aware of and acknowledge graciously (without discounting) praise and compliments from others. 12. Interact socially without undue fear or anxiety. 13. Minimize ADD behavioral interference in daily life. 14. Reach a personal balance between solitary time and interpersonal interaction with others. Objective Identify, challenge, and replace biased, fearful self-talk with reality-based, positive self-talk. Target Date: 2022-02-10 Frequency: Monthly  Progress: 20 Modality: individual  Objective Implement relapse prevention strategies for managing possible future anxiety symptoms. Target Date: 2022-02-10 Frequency: Monthly  Progress: 10 Modality: individual  Objective Learn and implement social skills to reduce anxiety and build confidence in social  interactions. Target Date: 2022-02-10 Frequency: Monthly  Progress: 30 Modality: individual  Related Interventions Use instruction, modeling, and role-playing to build the client's general social and/or communication skills (Cognitive Behavioral Group Therapy for Social Phobia by Rosana HoesHeimberg and Becker; Managing Social Anxiety by ElburnHope, Laurena BeringHeimberg, and Lindenurk). 15. Recognize, accept, and cope with feelings of depression. 16. Reduce impulsive actions while increasing concentration and focus on low-interest activities. 17. Sustain attention and concentration for consistently longer periods of time.  Diagnosis:Generalized anxiety disorder  Major depressive disorder, recurrent episode, moderate (HCC)  Plan:  -next appointment is Monday, May 27, 2021 at 10am.   Teofilo Pod, [facility]  [place], [facility]

## 2021-05-27 ENCOUNTER — Encounter: Payer: Self-pay | Admitting: Professional

## 2021-05-27 ENCOUNTER — Ambulatory Visit (INDEPENDENT_AMBULATORY_CARE_PROVIDER_SITE_OTHER): Payer: 59 | Admitting: Professional

## 2021-05-27 DIAGNOSIS — F331 Major depressive disorder, recurrent, moderate: Secondary | ICD-10-CM | POA: Diagnosis not present

## 2021-05-27 DIAGNOSIS — F411 Generalized anxiety disorder: Secondary | ICD-10-CM

## 2021-05-27 NOTE — Progress Notes (Signed)
[facility] Counselor/Therapist Progress Note  Patient ID: Joel Ferguson, MRN: [id number],    Date: 05/27/2021  Time Spent: 60 minutes 10-11 am   Treatment Type: Individual Therapy  Reported Symptoms: tired, self-conscious  Mental Status Exam: Appearance:  Casual     Behavior: Appropriate and Sharing  Motor: Normal  Speech/Language:  Clear and Coherent and Normal Rate  Affect: Full Range  Mood: normal  Thought process: goal directed  Thought content:   WNL  Sensory/Perceptual disturbances:   WNL  Orientation: oriented to person, place, time/date, and situation  Attention: Good  Concentration: Good  Memory: WNL  Fund of knowledge:  Good  Insight:   Good  Judgment:  Good  Impulse Control: Good   Risk Assessment: Danger to Self:  No Self-injurious Behavior: No Danger to Others: No Duty to Warn:no Physical Aggression / Violence:No  Access to Firearms a concern: No  Gang Involvement:No   Subjective: This session was held via video teletherapy due to the coronavirus risk at this time. The patient consented to video teletherapy and was located at his home during this session. He is aware it is the responsibility of the patient to secure confidentiality on his end of the session. The provider was in a private home office for the duration of this session.  The patient arrived on time for his webex session.  Issues addressed: 1- marital -working to help is wife cope with stress of working -she will be meeting with her boss today to see if boss will accept  2-self-esteem -self-conscious of skin   -due to seborrheic dermatitis -coping skills   -what he has done   -additional coping skills -notices that he is liking how he looks more than he used to with his body   -admits it took his spouse' reinforcement of her liking his body 3-self-care -has started walking several times per week with a friend at the gym -does not always get to relax due to calls from  his wife -suggested he consider letting her know that he is going to mute calls for the time he is in the gym -reviewed importance of him taking care of himself so that he can be able to care for the needs of his family 4-parenting -suggested pt's wife was giving her power away to pt when she calls him to handle issues with Irving Copas -he believes she is incapable of managing her emotions due to her autism and limited coping   -suggested she needed to get in therapy   -suggested importance for her to be able to manage Irving Copas now before he becomes more challenging than she can handle  Problems Addressed  Attention Deficit Disorder (ADD) - Adult, Low Self-Esteem, Social Anxiety, Unipolar Depression  Goals 1. Accept ADD as a chronic issue and need for continuing medication treatment. 2. Achieve a satisfactory level of balance, structure, and intimacy in personal life. Objective Increase knowledge of ADHD and its treatment. Target Date: 2022-02-10 Frequency: Monthly  Progress: 30 Modality: individual  Objective Acknowledge procrastination and the need to reduce it. Target Date: 2022-02-10 Frequency: Monthly  Progress: 10 Modality: individual  Objective Identify the current specific ADD behaviors that cause the most difficulty. Target Date: 2022-02-10 Frequency: Monthly  Progress: 0 Modality: individual  Objective Implement relaxation procedures to reduce tension and physical restlessness. Target Date: 2022-02-10 Frequency: Monthly  Progress: 40 Modality: individual  Objective Invite a significant other to join in the therapy to provide support throughout therapy. Target Date: 2022-02-10 Frequency: Monthly  Progress: 20 Modality: individual  Objective Learn and implement skills to reduce the disruptive influence of distractibility. Target Date: 2022-02-10 Frequency: Monthly  Progress: 0 Modality: individual  Objective Learn and implement skills to reduce procrastination. Target Date:  2022-02-10 Frequency: Monthly  Progress: 10 Modality: individual  3. Alleviate depressive symptoms and return to previous level of effective functioning. 4. Demonstrate improved self-esteem through more pride in appearance, more assertiveness, greater eye contact, and identification of positive traits in self-talk messages. 5. Develop a consistent, positive self-image. 6. Develop healthy interpersonal relationships that lead to the alleviation and help prevent the relapse of depression. Objective Learn and implement relapse prevention skills. Target Date: 2022-02-10 Frequency: Monthly  Progress: 0 Modality: individual  Related Interventions Discuss with the client the distinction between a lapse and relapse, associating a lapse with a rather common, temporary setback that may involve, for example, re-experiencing a depressive thought and/or urge to withdraw or avoid (perhaps as related to some loss or conflict) and a relapse as a sustained return to a pattern of depressive thinking and feeling usually accompanied by interpersonal withdrawal and/or avoidance. Objective Identify and replace thoughts and beliefs that support depression. Target Date: 2022-02-10 Frequency: Monthly  Progress: 40 Modality: individual  Related Interventions Explore and restructure underlying assumptions and beliefs reflected in biased self-talk that may put the client at risk for relapse or recurrence. Facilitate and reinforce the client's shift from biased depressive self-talk and beliefs to reality-based cognitive messages that enhance self-confidence and increase adaptive actions (see "Positive Self-Talk" in the Adult Psychotherapy Homework Planner by Stephannie Li). Assign "behavioral experiments" in which depressive automatic thoughts are treated as hypotheses/prediction, reality-based alternative hypotheses/prediction are generated, and both are tested against the client's past, present, and/or future  experiences. Conduct Cognitive-Behavioral Therapy (see Cognitive Behavior Therapy by Reola Calkins; Overcoming Depression by Agapito Games al.), beginning with helping the client learn the connection among cognition, depressive feelings, and actions. Objective Participate in couples therapy to decrease depression and improve the relationship. Target Date: 2022-02-10 Frequency: Monthly  Progress: 50 Modality: individual  Objective Learn and implement behavioral strategies to overcome depression. Target Date: 2022-02-10 Frequency: Monthly  Progress: 10 Modality: individual  Related Interventions Assist the client in developing skills that increase the likelihood of deriving pleasure from behavioral activation (e.g., assertiveness skills, developing an exercise plan, less internal/more external focus, increased social involvement); reinforce success. Engage the client in "behavioral activation," increasing his/her activity level and contact with sources of reward, while identifying processes that inhibit activation (see Behavioral Activation for Depression by Katharine Look, Dimidjian, and Herman-Dunn; or assign "Identify and Schedule Pleasant Activities" in the Adult Psychotherapy Homework Planner by [facility]); use behavioral techniques such as instruction, rehearsal, role-playing, role reversal, as needed, to facilitate activity in the client's daily life; reinforce success. Objective Learn and implement conflict resolution skills to resolve interpersonal problems. Target Date: 2022-02-10 Frequency: Monthly  Progress: 40 Modality: individual  Related Interventions Teach conflict resolution skills (e.g., empathy, active listening, "I messages," respectful communication, assertiveness without aggression, compromise); use psychoeducation, modeling, role-playing, and rehearsal to work through several current conflicts; assign homework exercises; review and repeat so as to integrate their use into the client's life. 7.  Develop healthy thinking patterns and beliefs about self, others, and the world that lead to the alleviation and help prevent the relapse of depression. 8. Develop the essential social skills that will enhance the quality of relationship life. 9. Elevate self-esteem. 10. Establish an inward sense of self-worth, confidence, and competence. 11. Interact socially without undue  distress or disability. Objective Decrease the frequency of negative self-descriptive statements and increase frequency of positive self-descriptive statements. Target Date: 2022-02-10 Frequency: Monthly  Progress: 30 Modality: individual  Related Interventions Assist the client in developing positive self-talk as a way of boosting his/her confidence and self-image (or assign "Positive Self-Talk" in the Adult Psychotherapy Homework Planner by Stephannie Li). Help the client reframe his/her negative assessment of himself/herself. Assist the client in becoming aware of how he/she expresses or acts out negative feelings about himself/herself. Objective Form realistic, appropriate, and attainable goals for self in all areas of life. Target Date: 2022-02-10 Frequency: Monthly  Progress: 20 Modality: individual  Related Interventions Assign the client to make a list of goals for various areas of life and a plan for steps toward goal attainment. Help the client analyze his/her goals to make sure they are realistic and attainable. Objective Identify and engage in activities that would improve self-image by being consistent with one's values. Target Date: 2022-02-10 Frequency: Monthly  Progress: 30 Modality: individual  Related Interventions Identify and assign activities congruent with the client's values; process them toward improving self-concept and self-esteem. Help the client analyze his/her values and the congruence or incongruence between them and the client's daily activities. Objective Identify and replace negative  self-talk messages used to reinforce low self-esteem. Target Date: 2022-02-10 Frequency: Monthly  Progress: 50 Modality: individual  Related Interventions Help the client identify his/her distorted, negative beliefs about self and the world and replace these messages with more realistic, affirmative messages (or assign "Journal and Replace Self-Defeating Thoughts" in the Adult Psychotherapy Homework Planner by [facility] or read What to Say When You Talk to Yourself by Helmstetter). Objective Take responsibility for daily grooming and personal hygiene. Target Date: 2022-02-10 Frequency: Monthly  Progress: 40 Modality: individual  Related Interventions Monitor and give feedback to the client on his/her grooming and hygiene. Objective Positively acknowledge verbal compliments from others. Target Date: 2022-02-10 Frequency: Monthly  Progress: 10 Modality: individual  Related Interventions Assign the client to be aware of and acknowledge graciously (without discounting) praise and compliments from others. 12. Interact socially without undue fear or anxiety. 13. Minimize ADD behavioral interference in daily life. 14. Reach a personal balance between solitary time and interpersonal interaction with others. Objective Identify, challenge, and replace biased, fearful self-talk with reality-based, positive self-talk. Target Date: 2022-02-10 Frequency: Monthly  Progress: 20 Modality: individual  Objective Implement relapse prevention strategies for managing possible future anxiety symptoms. Target Date: 2022-02-10 Frequency: Monthly  Progress: 10 Modality: individual  Objective Learn and implement social skills to reduce anxiety and build confidence in social interactions. Target Date: 2022-02-10 Frequency: Monthly  Progress: 30 Modality: individual  Related Interventions Use instruction, modeling, and role-playing to build the client's general social and/or communication skills (Cognitive  Behavioral Group Therapy for Social Phobia by Rosana Hoes; Managing Social Anxiety by [place], Laurena Bering, and [place]). 15. Recognize, accept, and cope with feelings of depression. 16. Reduce impulsive actions while increasing concentration and focus on low-interest activities. 17. Sustain attention and concentration for consistently longer periods of time.  Diagnosis:Generalized anxiety disorder  Major depressive disorder, recurrent episode, moderate (HCC)  Plan:  -next appointment is Tuesday, June 25, 2021 at 9am.   Teofilo Pod, [facility]

## 2021-06-14 ENCOUNTER — Ambulatory Visit: Payer: 59 | Admitting: Professional

## 2021-06-20 ENCOUNTER — Other Ambulatory Visit: Payer: Self-pay | Admitting: Family Medicine

## 2021-06-20 DIAGNOSIS — R635 Abnormal weight gain: Secondary | ICD-10-CM

## 2021-06-25 ENCOUNTER — Ambulatory Visit (INDEPENDENT_AMBULATORY_CARE_PROVIDER_SITE_OTHER): Payer: 59 | Admitting: Professional

## 2021-06-25 ENCOUNTER — Encounter: Payer: Self-pay | Admitting: Professional

## 2021-06-25 DIAGNOSIS — F331 Major depressive disorder, recurrent, moderate: Secondary | ICD-10-CM | POA: Diagnosis not present

## 2021-06-25 DIAGNOSIS — F411 Generalized anxiety disorder: Secondary | ICD-10-CM | POA: Diagnosis not present

## 2021-06-25 NOTE — Progress Notes (Signed)
[facility] Counselor/Therapist Progress Note ? ?Patient ID: Joel Ferguson, MRN: [id number],   ? ?Date: 06/25/2021 ? ?Time Spent: 50 minutes 905-955 am ? ?Treatment Type: Individual Therapy ? ?Subjective: This session was held via video teletherapy due to the coronavirus risk at this time. The patient consented to video teletherapy and was located at his home during this session. He is aware it is the responsibility of the patient to secure confidentiality on his end of the session. The provider was in a private home office for the duration of this session. ? ?The patient arrived on time for his webex session. ? ?Issues addressed: ?1- marital ?-his wife worked out a two week notice since they actually wanted her to increase her hours ?-she has been back home for two weeks and has had reduced symptoms ?-she has a new therapist to address her issues out of [place] ?  -he feels hopeful because for the firs time she was completely open about her symptoms ?2-social supports ?-completed "The Circle Thing" using IPT approach ?-shared his wife and friend Nicki Reaper are only inner circle supports ?-Scott's wife Debarah Crape (Scott's friend), and Hydrographic surveyor (lives in [place]) ?  -would want closer friendship with Clint but distance is prohibitive ?  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  -he has a "checkered past" ?  -he is about ten years older ?-his parents are nowhere on the page and he does not see them as a support ?3-Negative thinking around socializing ?-he told himself and is still telling himself that it is related to how he looks ?-the skin thing ?-he realizes no one is taking a close look at his skin ?-awkward moments in HS and college ? -cannot ID any triggers but it changed in college ? -almost like someone turned off the light switch ? -he does not know why it flipped ? -his parents did nothing to support his social development ?-he met [name] by getting on the incoming students  group ? -he put out a post to get together and met [name] ? -Ledell Noss is very social and has an anxiety that if she doesn't see her friend they will not like her anymore ? ?Problems Addressed  ?Attention Deficit Disorder (ADD) - Adult, Low Self-Esteem, Social Anxiety, Unipolar Depression  ?Goals ?1. Accept ADD as a chronic issue and need for continuing medication treatment. ?2. Achieve a satisfactory level of balance, structure, and intimacy in personal life. ?Objective ?Increase knowledge of ADHD and its treatment. ?Target Date: 2022-02-10 Frequency: Monthly  ?Progress: 30 Modality: individual  ?Objective ?Acknowledge procrastination and the need to reduce it. ?Target Date: 2022-02-10 Frequency: Monthly  ?Progress: 10 Modality: individual  ?Objective ?Identify the current specific ADD behaviors that cause the most difficulty. ?Target Date: 2022-02-10 Frequency: Monthly  ?Progress: 0 Modality: individual  ?Objective ?Implement relaxation procedures to reduce tension and physical restlessness. ?Target Date: 2022-02-10 Frequency: Monthly  ?Progress: 40 Modality: individual  ?Objective ?Invite a significant other to join in the therapy to provide support throughout therapy. ?Target Date: 2022-02-10 Frequency: Monthly  ?Progress: 20 Modality: individual  ?Objective ?Learn and implement skills to reduce the disruptive influence of distractibility. ?Target Date: 2022-02-10 Frequency: Monthly  ?Progress: 0 Modality: individual  ?Objective ?Learn and implement skills to reduce procrastination. ?Target Date: 2022-02-10 Frequency: Monthly  ?Progress: 10 Modality: individual  ?3. Alleviate depressive symptoms and return to previous level of effective functioning. ?4. Demonstrate improved self-esteem through more pride in appearance, more assertiveness, greater eye  contact, and identification of positive traits in self-talk messages. ?5. Develop a consistent, positive self-image. ?6. Develop healthy interpersonal relationships that  lead to the alleviation and help prevent the relapse of depression. ?Objective ?Learn and implement relapse prevention skills. ?Target Date: 2022-02-10 Frequency: Monthly  ?Progress: 0 Modality: individual  ?Related Interventions ?Discuss with the client the distinction between a lapse and relapse, associating a lapse with a rather common, temporary setback that may involve, for example, re-experiencing a depressive thought and/or urge to withdraw or avoid (perhaps as related to some loss or conflict) and a relapse as a sustained return to a pattern of depressive thinking and feeling usually accompanied by interpersonal withdrawal and/or avoidance. ?Objective ?Identify and replace thoughts and beliefs that support depression. ?Target Date: 2022-02-10 Frequency: Monthly  ?Progress: 40 Modality: individual  ?Related Interventions ?Explore and restructure underlying assumptions and beliefs reflected in biased self-talk that may put the client at risk for relapse or recurrence. ?Facilitate and reinforce the client's shift from biased depressive self-talk and beliefs to reality-based cognitive messages that enhance self-confidence and increase adaptive actions (see "Positive Self-Talk" in the Adult Psychotherapy Homework Planner by Bryn Gulling). ?Assign "behavioral experiments" in which depressive automatic thoughts are treated as hypotheses/prediction, reality-based alternative hypotheses/prediction are generated, and both are tested against the client's past, present, and/or future experiences. ?Conduct Cognitive-Behavioral Therapy (see Cognitive Behavior Therapy by Olevia Bowens; Overcoming Depression by Lynita Lombard al.), beginning with helping the client learn the connection among cognition, depressive feelings, and actions. ?Objective ?Participate in couples therapy to decrease depression and improve the relationship. ?Target Date: 2022-02-10 Frequency: Monthly  ?Progress: 50 Modality: individual  ?Objective ?Learn and implement  behavioral strategies to overcome depression. ?Target Date: 2022-02-10 Frequency: Monthly  ?Progress: 10 Modality: individual  ?Related Interventions ?Assist the client in developing skills that increase the likelihood of deriving pleasure from behavioral activation (e.g., assertiveness skills, developing an exercise plan, less internal/more external focus, increased social involvement); reinforce success. ?Engage the client in "behavioral activation," increasing his/her activity level and contact with sources of reward, while identifying processes that inhibit activation (see Behavioral Activation for Depression by Beverly Gust, Dimidjian, and Herman-Dunn; or assign "Identify and Schedule Pleasant Activities" in the Adult Psychotherapy Homework Planner by [facility]); use behavioral techniques such as instruction, rehearsal, role-playing, role reversal, as needed, to facilitate activity in the client's daily life; reinforce success. ?Objective ?Learn and implement conflict resolution skills to resolve interpersonal problems. ?Target Date: 2022-02-10 Frequency: Monthly  ?Progress: 40 Modality: individual  ?Related Interventions ?Teach conflict resolution skills (e.g., empathy, active listening, "I messages," respectful communication, assertiveness without aggression, compromise); use psychoeducation, modeling, role-playing, and rehearsal to work through several current conflicts; assign homework exercises; review and repeat so as to integrate their use into the client's life. ?7. Develop healthy thinking patterns and beliefs about self, others, and the world that lead to the alleviation and help prevent the relapse of depression. ?8. Develop the essential social skills that will enhance the quality of relationship life. ?9. Elevate self-esteem. ?10. Establish an inward sense of self-worth, confidence, and competence. ?11. Interact socially without undue distress or disability. ?Objective ?Decrease the frequency of negative  self-descriptive statements and increase frequency of positive self-descriptive statements. ?Target Date: 2022-02-10 Frequency: Monthly  ?Progress: 30 Modality: individual  ?Related Interventions ?Assist the

## 2021-06-26 ENCOUNTER — Encounter: Payer: Self-pay | Admitting: Family Medicine

## 2021-06-27 LAB — COMPLETE METABOLIC PANEL WITH GFR
AG Ratio: 1.6 (calc) (ref 1.0–2.5)
ALT: 31 U/L (ref 9–46)
AST: 19 U/L (ref 10–40)
Albumin: 4.4 g/dL (ref 3.6–5.1)
Alkaline phosphatase (APISO): 63 U/L (ref 36–130)
BUN: 13 mg/dL (ref 7–25)
CO2: 27 mmol/L (ref 20–32)
Calcium: 9.4 mg/dL (ref 8.6–10.3)
Chloride: 104 mmol/L (ref 98–110)
Creat: 0.96 mg/dL (ref 0.60–1.24)
Globulin: 2.7 g/dL (calc) (ref 1.9–3.7)
Glucose, Bld: 87 mg/dL (ref 65–99)
Potassium: 4.5 mmol/L (ref 3.5–5.3)
Sodium: 141 mmol/L (ref 135–146)
Total Bilirubin: 0.5 mg/dL (ref 0.2–1.2)
Total Protein: 7.1 g/dL (ref 6.1–8.1)
eGFR: 112 mL/min/{1.73_m2} (ref >=60)

## 2021-06-27 LAB — CBC WITH DIFFERENTIAL/PLATELET
Absolute Monocytes: 608 cells/uL (ref 200–950)
Basophils Absolute: 70 cells/uL (ref 0–200)
Basophils Relative: 0.9 %
Eosinophils Absolute: 226 cells/uL (ref 15–500)
Eosinophils Relative: 2.9 %
HCT: 47.6 % (ref 38.5–50.0)
Hemoglobin: 15.8 g/dL (ref 13.2–17.1)
Lymphs Abs: 2254 cells/uL (ref 850–3900)
MCH: 29 pg (ref 27.0–33.0)
MCHC: 33.2 g/dL (ref 32.0–36.0)
MCV: 87.5 fL (ref 80.0–100.0)
MPV: 11.7 fL (ref 7.5–12.5)
Monocytes Relative: 7.8 %
Neutro Abs: 4641 cells/uL (ref 1500–7800)
Neutrophils Relative %: 59.5 %
Platelets: 246 10*3/uL (ref 140–400)
RBC: 5.44 10*6/uL (ref 4.20–5.80)
RDW: 12.9 % (ref 11.0–15.0)
Total Lymphocyte: 28.9 %
WBC: 7.8 10*3/uL (ref 3.8–10.8)

## 2021-06-27 LAB — LIPID PANEL W/REFLEX DIRECT LDL
Cholesterol: 159 mg/dL (ref <200)
HDL: 36 mg/dL — ABNORMAL LOW (ref >=40)
LDL Cholesterol (Calc): 102 mg/dL (calc) — ABNORMAL HIGH
Non-HDL Cholesterol (Calc): 123 mg/dL (calc) (ref <130)
Total CHOL/HDL Ratio: 4.4 (calc) (ref <5.0)
Triglycerides: 118 mg/dL (ref <150)

## 2021-06-27 LAB — TSH: TSH: 3.46 mIU/L (ref 0.40–4.50)

## 2021-06-27 LAB — HEMOGLOBIN A1C
Hgb A1c MFr Bld: 5.5 % of total Hgb (ref <5.7)
Mean Plasma Glucose: 111 mg/dL
eAG (mmol/L): 6.2 mmol/L

## 2021-07-02 ENCOUNTER — Encounter: Payer: Self-pay | Admitting: Family Medicine

## 2021-07-02 ENCOUNTER — Other Ambulatory Visit: Payer: Self-pay

## 2021-07-02 ENCOUNTER — Ambulatory Visit (INDEPENDENT_AMBULATORY_CARE_PROVIDER_SITE_OTHER): Payer: 59 | Admitting: Family Medicine

## 2021-07-02 VITALS — BP 122/81 | HR 89 | Ht 74.0 in | Wt 302.0 lb

## 2021-07-02 DIAGNOSIS — Z23 Encounter for immunization: Secondary | ICD-10-CM

## 2021-07-02 DIAGNOSIS — Z0000 Encounter for general adult medical examination without abnormal findings: Secondary | ICD-10-CM | POA: Diagnosis not present

## 2021-07-02 DIAGNOSIS — R5383 Other fatigue: Secondary | ICD-10-CM

## 2021-07-02 MED ORDER — SERTRALINE HCL 50 MG PO TABS: 50.0000 mg | ORAL_TABLET | Freq: Every day | ORAL | 1 refills | Status: DC

## 2021-07-02 NOTE — Patient Instructions (Signed)
Preventive Care 21-26 Years Old, Male ?Preventive care refers to lifestyle choices and visits with your health care provider that can promote health and wellness. Preventive care visits are also called wellness exams. ?What can I expect for my preventive care visit? ?Counseling ?During your preventive care visit, your health care provider may ask about your: ?Medical history, including: ?Past medical problems. ?Family medical history. ?Current health, including: ?Emotional well-being. ?Home life and relationship well-being. ?Sexual activity. ?Lifestyle, including: ?Alcohol, nicotine or tobacco, and drug use. ?Access to firearms. ?Diet, exercise, and sleep habits. ?Safety issues such as seatbelt and bike helmet use. ?Sunscreen use. ?Work and work environment. ?Physical exam ?Your health care provider may check your: ?Height and weight. These may be used to calculate your BMI (body mass index). BMI is a measurement that tells if you are at a healthy weight. ?Waist circumference. This measures the distance around your waistline. This measurement also tells if you are at a healthy weight and may help predict your risk of certain diseases, such as type 2 diabetes and high blood pressure. ?Heart rate and blood pressure. ?Body temperature. ?Skin for abnormal spots. ?What immunizations do I need? ?Vaccines are usually given at various ages, according to a schedule. Your health care provider will recommend vaccines for you based on your age, medical history, and lifestyle or other factors, such as travel or where you work. ?What tests do I need? ?Screening ?Your health care provider may recommend screening tests for certain conditions. This may include: ?Lipid and cholesterol levels. ?Diabetes screening. This is done by checking your blood sugar (glucose) after you have not eaten for a while (fasting). ?Hepatitis B test. ?Hepatitis C test. ?HIV (human immunodeficiency virus) test. ?STI (sexually transmitted infection)  testing, if you are at risk. ?Talk with your health care provider about your test results, treatment options, and if necessary, the need for more tests. ?Follow these instructions at home: ?Eating and drinking ? ?Eat a healthy diet that includes fresh fruits and vegetables, whole grains, lean protein, and low-fat dairy products. ?Drink enough fluid to keep your urine pale yellow. ?Take vitamin and mineral supplements as recommended by your health care provider. ?Do not drink alcohol if your health care provider tells you not to drink. ?If you drink alcohol: ?Limit how much you have to 0-2 drinks a day. ?Know how much alcohol is in your drink. In the [place] one drink equals one 12 oz bottle of beer (355 mL), one 5 oz glass of wine (148 mL), or one 1? oz glass of hard liquor (44 mL). ?Lifestyle ?Brush your teeth every morning and night with fluoride toothpaste. Floss one time each day. ?Exercise for at least 30 minutes 5 or more days each week. ?Do not use any products that contain nicotine or tobacco. These products include cigarettes, chewing tobacco, and vaping devices, such as e-cigarettes. If you need help quitting, ask your health care provider. ?Do not use drugs. ?If you are sexually active, practice safe sex. Use a condom or other form of protection to prevent STIs. ?Find healthy ways to manage stress, such as: ?Meditation, yoga, or listening to music. ?Journaling. ?Talking to a trusted person. ?Spending time with friends and family. ?Minimize exposure to UV radiation to reduce your risk of skin cancer. ?Safety ?Always wear your seat belt while driving or riding in a vehicle. ?Do not drive: ?If you have been drinking alcohol. Do not ride with someone who has been drinking. ?If you have been using any mind-altering substances or   drugs. ?While texting. ?When you are tired or distracted. ?Wear a helmet and other protective equipment during sports activities. ?If you have firearms in your house, make sure you  follow all gun safety procedures. ?Seek help if you have been physically or sexually abused. ?What's next? ?Go to your health care provider once a year for an annual wellness visit. ?Ask your health care provider how often you should have your eyes and teeth checked. ?Stay up to date on all vaccines. ?This information is not intended to replace advice given to you by your health care provider. Make sure you discuss any questions you have with your health care provider. ?Document Revised: 09/26/2020 Document Reviewed: 09/26/2020 ?Elsevier Patient Education ? 2022 [employer] ? ?

## 2021-07-02 NOTE — Progress Notes (Signed)
?Joel Ferguson - 26 y.o. male MRN [id number]  Date of birth: 1996/03/16 ? ?Subjective ?No chief complaint on file. ? ? ?HPI ?Joel Ferguson is a 26 year old male here today for annual exam.  Reports overall he is doing well.  Concerned about continued fatigue.  He did have labs completed prior to visit today.  We reviewed those in detail and these are relatively normal other than mild elevation in LDL levels. ? ?He has been out of sertraline and would like to restart this. ? ?He reports that he is exercising regularly.  Feels like his diet is okay but could use some improvement. ? ?Is a non-smoker.  He rarely consumes alcohol. ? ?Review of Systems  ?Constitutional:  Negative for chills, fever, malaise/fatigue and weight loss.  ?HENT:  Negative for congestion, ear pain and sore throat.   ?Eyes:  Negative for blurred vision, double vision and pain.  ?Respiratory:  Negative for cough and shortness of breath.   ?Cardiovascular:  Negative for chest pain and palpitations.  ?Gastrointestinal:  Negative for abdominal pain, blood in stool, constipation, heartburn and nausea.  ?Genitourinary:  Negative for dysuria and urgency.  ?Musculoskeletal:  Negative for joint pain and myalgias.  ?Neurological:  Negative for dizziness and headaches.  ?Endo/Heme/Allergies:  Does not bruise/bleed easily.  ?Psychiatric/Behavioral:  Negative for depression. The patient is not nervous/anxious and does not have insomnia.   ? ? ?Allergies  ?Allergen Reactions  ? Nickel Itching  ? ? ?Past Medical History:  ?Diagnosis Date  ? Abscess of knee   ? Obesity   ? ? ?Past Surgical History:  ?Procedure Laterality Date  ? WISDOM TOOTH EXTRACTION    ? ? ?Social History  ? ?Socioeconomic History  ? Marital status: Married  ?  Spouse name: Not on file  ? Number of children: 1  ? Years of education: Not on file  ? Highest education level: Not on file  ?Occupational History  ? Not on file  ?Tobacco Use  ? Smoking status: Never  ? Smokeless tobacco: Never  ?Vaping Use  ?  Vaping Use: Never used  ?Substance and Sexual Activity  ? Alcohol use: Yes  ?  Comment: OCCASSION'S  ? Drug use: No  ? Sexual activity: Not Currently  ?  Birth control/protection: None  ?Other Topics Concern  ? Not on file  ?Social History Narrative  ? Not on file  ? ?Social Determinants of Health  ? ?Financial Resource Strain: Not on file  ?Food Insecurity: Not on file  ?Transportation Needs: Not on file  ?Physical Activity: Not on file  ?Stress: Not on file  ?Social Connections: Not on file  ? ? ?Family History  ?Problem Relation Age of Onset  ? Gallstones Mother   ? Colon polyps Maternal Grandmother   ? Edema Paternal Grandmother   ? Pancreatic cancer Other   ? ? ?Health Maintenance  ?Topic Date Due  ? INFLUENZA VACCINE  07/12/2021 (Originally 11/12/2020)  ? COVID-19 Vaccine (4 - Booster for Moderna series) 01/12/2022 (Originally 09/13/2020)  ? HPV VACCINES (1 - Male 2-dose series) 07/03/2022 (Originally 09/04/2006)  ? Hepatitis C Screening  07/03/2022 (Originally 09/03/2013)  ? HIV Screening  07/03/2022 (Originally 09/04/2010)  ? TETANUS/TDAP  07/03/2031  ? ? ? ?----------------------------------------------------------------------------------------------------------------------------------------------------------------------------------------------------------------- ?Physical Exam ?BP 122/81 (BP Location: Left Arm, Patient Position: Sitting, Cuff Size: Large)   Pulse 89   Ht 6\' 2"  (1.88 m)   Wt (!) 302 lb (137 kg)   SpO2 96%   BMI 38.77 kg/m?  ? ?Physical  Exam ?Constitutional:   ?   General: He is not in acute distress. ?HENT:  ?   Head: Normocephalic and atraumatic.  ?   Right Ear: Tympanic membrane and external ear normal.  ?   Left Ear: Tympanic membrane and external ear normal.  ?Eyes:  ?   General: No scleral icterus. ?Neck:  ?   Thyroid: No thyromegaly.  ?Cardiovascular:  ?   Rate and Rhythm: Normal rate and regular rhythm.  ?   Heart sounds: Normal heart sounds.  ?Pulmonary:  ?   Effort: Pulmonary effort  is normal.  ?   Breath sounds: Normal breath sounds.  ?Abdominal:  ?   General: Bowel sounds are normal. There is no distension.  ?   Palpations: Abdomen is soft.  ?   Tenderness: There is no abdominal tenderness. There is no guarding.  ?Musculoskeletal:  ?   Cervical back: Normal range of motion.  ?Lymphadenopathy:  ?   Cervical: No cervical adenopathy.  ?Skin: ?   General: Skin is warm and dry.  ?   Findings: No rash.  ?Neurological:  ?   Mental Status: He is alert and oriented to person, place, and time.  ?   Cranial Nerves: No cranial nerve deficit.  ?   Motor: No abnormal muscle tone.  ?Psychiatric:     ?   Mood and Affect: Mood normal.     ?   Behavior: Behavior normal.  ? ? ?------------------------------------------------------------------------------------------------------------------------------------------------------------------------------------------------------------------- ?Assessment and Plan ? ?Well adult exam ?Well adult ?Labs reviewed with him today.  Continues to have some fatigue and we will order testosterone and B12 levels in addition to previously ordered labs. ?Immunizations: Tdap ?Screenings: Up-to-date ?Anticipatory guidance/risk factor reduction: Recommendations per HPI ? ? ?Meds ordered this encounter  ?Medications  ? sertraline (ZOLOFT) 50 MG tablet  ?  Sig: Take 1 tablet (50 mg total) by mouth daily.  ?  Dispense:  90 tablet  ?  Refill:  1  ? ? ?No follow-ups on file. ? ? ? ?This visit occurred during the SARS-CoV-2 public health emergency.  Safety protocols were in place, including screening questions prior to the visit, additional usage of staff PPE, and extensive cleaning of exam room while observing appropriate contact time as indicated for disinfecting solutions.  ? ?

## 2021-07-02 NOTE — Assessment & Plan Note (Signed)
Well adult ?Labs reviewed with him today.  Continues to have some fatigue and we will order testosterone and B12 levels in addition to previously ordered labs. ?Immunizations: Tdap ?Screenings: Up-to-date ?Anticipatory guidance/risk factor reduction: Recommendations per HPI ?

## 2021-07-04 LAB — TESTOSTERONE TOTAL,FREE,BIO, MALES
Albumin: 4.3 g/dL (ref 3.6–5.1)
Sex Hormone Binding: 19 nmol/L (ref 10–50)
Testosterone: 230 ng/dL — ABNORMAL LOW (ref 250–827)

## 2021-07-04 LAB — VITAMIN B12: Vitamin B-12: 428 pg/mL (ref 200–1100)

## 2021-07-05 ENCOUNTER — Other Ambulatory Visit: Payer: Self-pay | Admitting: Family Medicine

## 2021-07-05 DIAGNOSIS — R7989 Other specified abnormal findings of blood chemistry: Secondary | ICD-10-CM

## 2021-07-09 ENCOUNTER — Ambulatory Visit: Payer: 59 | Admitting: Professional

## 2021-07-13 ENCOUNTER — Other Ambulatory Visit: Payer: Self-pay | Admitting: Family Medicine

## 2021-07-23 ENCOUNTER — Ambulatory Visit: Payer: 59 | Admitting: Professional

## 2021-08-06 ENCOUNTER — Ambulatory Visit: Payer: 59 | Admitting: Professional

## 2021-09-29 ENCOUNTER — Other Ambulatory Visit: Payer: Self-pay | Admitting: Family Medicine

## 2021-09-29 ENCOUNTER — Encounter: Payer: Self-pay | Admitting: Family Medicine

## 2021-09-30 ENCOUNTER — Ambulatory Visit (INDEPENDENT_AMBULATORY_CARE_PROVIDER_SITE_OTHER): Payer: 59 | Admitting: Family Medicine

## 2021-09-30 ENCOUNTER — Encounter: Payer: Self-pay | Admitting: Family Medicine

## 2021-09-30 DIAGNOSIS — F909 Attention-deficit hyperactivity disorder, unspecified type: Secondary | ICD-10-CM | POA: Insufficient documentation

## 2021-09-30 DIAGNOSIS — F902 Attention-deficit hyperactivity disorder, combined type: Secondary | ICD-10-CM | POA: Diagnosis not present

## 2021-09-30 MED ORDER — JORNAY PM 20 MG PO CP24
20.0000 mg | ORAL_CAPSULE | Freq: Every day | ORAL | 0 refills | Status: DC
Start: 2021-09-30 — End: 2021-09-30

## 2021-09-30 MED ORDER — JORNAY PM 20 MG PO CP24: 20.0000 mg | ORAL_CAPSULE | Freq: Every day | ORAL | 0 refills | Status: DC

## 2021-09-30 NOTE — Assessment & Plan Note (Signed)
ADHD assessment reviewed.  Sent to be scanned into his chart.  We discussed options for management of ADHD symptoms including both stimulant and nonstimulant options.  He is interested in trying Jornay p.m. which I think is a reasonable starting point.  We will initially start at 20 mg daily.  If for some reason his insurance does not cover this Metadate ER, Adderall XR or Vyvanse may be alternatives.

## 2021-09-30 NOTE — Patient Instructions (Signed)
Let's try the Jornay PM.  Let me know how this is going after a couple of weeks.  See me again in 6 weeks.

## 2021-09-30 NOTE — Progress Notes (Signed)
Joel Ferguson - 26 y.o. male MRN [id number]  Date of birth: [birth year]  Subjective No chief complaint on file.   HPI Joel Ferguson is a 26 year old male here today to review recent ADHD evaluation and discuss treatment.  He had ADHD evaluation completed by his therapist.  Findings are consistent with ADHD.  Medication as well as continued therapy is recommended.  He is here today to discuss medication.  He has never been on medication for management of ADD symptoms in the past.  He reports that he has a friend that is taking [place] PM and he would be interested in trying this initially.  No problems with hypertension.  ROS:  A comprehensive ROS was completed and negative except as noted per HPI  Allergies  Allergen Reactions   Nickel Itching    Past Medical History:  Diagnosis Date   Abscess of knee    Obesity     Past Surgical History:  Procedure Laterality Date   WISDOM TOOTH EXTRACTION      Social History   Socioeconomic History   Marital status: Married    Spouse name: Not on file   Number of children: 1   Years of education: Not on file   Highest education level: Not on file  Occupational History   Not on file  Tobacco Use   Smoking status: Never   Smokeless tobacco: Never  Vaping Use   Vaping Use: Never used  Substance and Sexual Activity   Alcohol use: Yes    Comment: OCCASSION'S   Drug use: No   Sexual activity: Not Currently    Birth control/protection: None  Other Topics Concern   Not on file  Social History Narrative   Not on file   Social Determinants of Health   Financial Resource Strain: Not on file  Food Insecurity: Not on file  Transportation Needs: Not on file  Physical Activity: Not on file  Stress: Not on file  Social Connections: Not on file    Family History  Problem Relation Age of Onset   Gallstones Mother    Colon polyps Maternal Grandmother    Edema Paternal Grandmother    Pancreatic cancer Other     Health Maintenance  Topic  Date Due   COVID-19 Vaccine (4 - Moderna series) 01/12/2022 (Originally 09/13/2020)   HPV VACCINES (1 - Male 2-dose series) 07/03/2022 (Originally 09/04/2006)   Hepatitis C Screening  07/03/2022 (Originally 09/03/2013)   HIV Screening  07/03/2022 (Originally 09/04/2010)   INFLUENZA VACCINE  11/12/2021   TETANUS/TDAP  07/03/2031     ----------------------------------------------------------------------------------------------------------------------------------------------------------------------------------------------------------------- Physical Exam BP 114/75 (BP Location: Right Arm, Patient Position: Sitting, Cuff Size: Large)   Pulse 83   Ht 6\' 2"  (1.88 m)   Wt 297 lb (134.7 kg)   SpO2 97%   BMI 38.13 kg/m   Physical Exam Constitutional:      Appearance: Normal appearance.  Eyes:     General: No scleral icterus. Cardiovascular:     Rate and Rhythm: Normal rate and regular rhythm.  Pulmonary:     Effort: Pulmonary effort is normal.     Breath sounds: Normal breath sounds.  Musculoskeletal:     Cervical back: Neck supple.  Neurological:     Mental Status: He is alert.  Psychiatric:        Mood and Affect: Mood normal.        Behavior: Behavior normal.     ------------------------------------------------------------------------------------------------------------------------------------------------------------------------------------------------------------------- Assessment and Plan  ADHD ADHD assessment reviewed.  Sent to be scanned  into his chart.  We discussed options for management of ADHD symptoms including both stimulant and nonstimulant options.  He is interested in trying Jornay p.m. which I think is a reasonable starting point.  We will initially start at 20 mg daily.  If for some reason his insurance does not cover this Metadate ER, Adderall XR or Vyvanse may be alternatives.     Meds ordered this encounter  Medications   DISCONTD: Methylphenidate HCl ER, PM,  (JORNAY PM) 20 MG CP24    Sig: Take 20 mg by mouth daily.    Dispense:  15 capsule    Refill:  0   Methylphenidate HCl ER, PM, (JORNAY PM) 20 MG CP24    Sig: Take 20 mg by mouth daily.    Dispense:  15 capsule    Refill:  0    Return in about 6 weeks (around 11/11/2021) for ADD follow up-Ok for virtual visit. .    This visit occurred during the SARS-CoV-2 public health emergency.  Safety protocols were in place, including screening questions prior to the visit, additional usage of staff PPE, and extensive cleaning of exam room while observing appropriate contact time as indicated for disinfecting solutions.

## 2021-10-01 MED ORDER — KETOCONAZOLE 2 % EX SHAM: 1.0000 | MEDICATED_SHAMPOO | CUTANEOUS | 0 refills | Status: DC

## 2021-10-02 ENCOUNTER — Encounter: Payer: Self-pay | Admitting: Family Medicine

## 2021-10-09 ENCOUNTER — Telehealth: Payer: Self-pay

## 2021-10-09 NOTE — Telephone Encounter (Addendum)
Initiated Prior authorization [id number] PM 20MG  er capsules Via: Covermymeds Case/Key: Status: approved  as of 10/09/21 Reason:approved through 10/10/2022 Notified Pt via: Mychart

## 2021-10-16 ENCOUNTER — Encounter: Payer: Self-pay | Admitting: Family Medicine

## 2021-10-16 MED ORDER — JORNAY PM 40 MG PO CP24: 40.0000 mg | ORAL_CAPSULE | Freq: Every day | ORAL | 0 refills | Status: DC

## 2021-10-28 ENCOUNTER — Telehealth (INDEPENDENT_AMBULATORY_CARE_PROVIDER_SITE_OTHER): Payer: 59 | Admitting: Family Medicine

## 2021-10-28 ENCOUNTER — Encounter: Payer: Self-pay | Admitting: Family Medicine

## 2021-10-28 DIAGNOSIS — F902 Attention-deficit hyperactivity disorder, combined type: Secondary | ICD-10-CM

## 2021-10-28 MED ORDER — JORNAY PM 60 MG PO CP24: 60.0000 mg | ORAL_CAPSULE | Freq: Every day | ORAL | 0 refills | Status: DC

## 2021-10-28 NOTE — Assessment & Plan Note (Signed)
He has noted improvement with Jornay PM at 40mg  but not quite enough.  Increasing to 60mg  as he is tolerating this well so far.  He will let me know if this strength is working well for him.

## 2021-10-28 NOTE — Progress Notes (Signed)
Joel Ferguson - 26 y.o. male MRN [id number]  Date of birth: [birth year]   This visit type was conducted due to national recommendations for restrictions regarding the COVID-19 Pandemic (e.g. social distancing).  This format is felt to be most appropriate for this patient at this time.  All issues noted in this document were discussed and addressed.  No physical exam was performed (except for noted visual exam findings with Video Visits).  I discussed the limitations of evaluation and management by telemedicine and the availability of in person appointments. The patient expressed understanding and agreed to proceed.  I connected withNAME@ on 10/28/21 at 11:30 AM EDT by a video enabled telemedicine application and verified that I am speaking with the correct person using two identifiers.  Present at visit: Joel Coombe, DO Eliberto Ivory Paar   Patient Location: Home [address] [place] [address]   Provider location:   [facility]  No chief complaint on file.   HPI  Authur Ferguson is a 26 y.o. male who presents via Web designer for a telehealth visit today.  Following up for ADHD.  Currently taking Jornay PM 40mg  daily.  Tolerating well without side effects.  Has noticed improvement of symptoms at current strength but doesn't feel that it is quite enough.     ROS:  A comprehensive ROS was completed and negative except as noted per HPI  Past Medical History:  Diagnosis Date   Abscess of knee    Obesity     Past Surgical History:  Procedure Laterality Date   WISDOM TOOTH EXTRACTION      Family History  Problem Relation Age of Onset   Gallstones Mother    Colon polyps Maternal Grandmother    Edema Paternal Grandmother    Pancreatic cancer Other     Social History   Socioeconomic History   Marital status: Married    Spouse name: Not on file   Number of children: 1   Years of education: Not on file   Highest education level: Not on file  Occupational  History   Not on file  Tobacco Use   Smoking status: Never   Smokeless tobacco: Never  Vaping Use   Vaping Use: Never used  Substance and Sexual Activity   Alcohol use: Yes    Comment: OCCASSION'S   Drug use: No   Sexual activity: Not Currently    Birth control/protection: None  Other Topics Concern   Not on file  Social History Narrative   Not on file   Social Determinants of Health   Financial Resource Strain: Not on file  Food Insecurity: Not on file  Transportation Needs: Not on file  Physical Activity: Not on file  Stress: Not on file  Social Connections: Not on file  Intimate Partner Violence: Not on file     Current Outpatient Medications:    Methylphenidate HCl ER, PM, (JORNAY PM) 60 MG CP24, Take 60 mg by mouth daily., Disp: 15 capsule, Rfl: 0   albuterol (VENTOLIN HFA) 108 (90 Base) MCG/ACT inhaler, Inhale 2 puffs into the lungs every 6 (six) hours as needed for wheezing or shortness of breath. (Patient not taking: Reported on 09/30/2021), Disp: 18 g, Rfl: 1   clobetasol (TEMOVATE) 0.05 % external solution, Apply 1 application topically 2 (two) times daily. Use on scalp/neck, Disp: 50 mL, Rfl: 2   ketoconazole (NIZORAL) 2 % shampoo, Apply 1 Application topically 2 (two) times a week., Disp: 120 mL, Rfl: 0   nystatin (MYCOSTATIN/NYSTOP) powder,  Apply 1 application topically 3 (three) times daily., Disp: 60 g, Rfl: 1   triamcinolone (KENALOG) 0.1 %, Apply 1 application topically 2 (two) times daily. Use for rash on arms as needed., Disp: 30 g, Rfl: 0  EXAM:  VITALS per patient if applicable: There were no vitals taken for this visit.  GENERAL: alert, oriented, appears well and in no acute distress  HEENT: atraumatic, conjunttiva clear, no obvious abnormalities on inspection of external nose and ears  NECK: normal movements of the head and neck  LUNGS: on inspection no signs of respiratory distress, breathing rate appears normal, no obvious gross SOB, gasping or  wheezing  CV: no obvious cyanosis  MS: moves all visible extremities without noticeable abnormality  PSYCH/NEURO: pleasant and cooperative, no obvious depression or anxiety, speech and thought processing grossly intact  ASSESSMENT AND PLAN:  Discussed the following assessment and plan:  ADHD He has noted improvement with Jornay PM at 40mg  but not quite enough.  Increasing to 60mg  as he is tolerating this well so far.  He will let me know if this strength is working well for him.       I discussed the assessment and treatment plan with the patient. The patient was provided an opportunity to ask questions and all were answered. The patient agreed with the plan and demonstrated an understanding of the instructions.   The patient was advised to call back or seek an in-person evaluation if the symptoms worsen or if the condition fails to improve as anticipated.    , DO

## 2021-11-10 ENCOUNTER — Encounter: Payer: Self-pay | Admitting: Family Medicine

## 2021-11-11 MED ORDER — JORNAY PM 60 MG PO CP24: 60.0000 mg | ORAL_CAPSULE | Freq: Every day | ORAL | 0 refills | Status: DC

## 2021-11-11 NOTE — Telephone Encounter (Signed)
Completed.  CM

## 2021-11-18 MED ORDER — JORNAY PM 60 MG PO CP24: 60.0000 mg | ORAL_CAPSULE | Freq: Every day | ORAL | 0 refills | Status: DC

## 2021-11-18 NOTE — Addendum Note (Signed)
Addended by: Mammie Lorenzo on: 11/18/2021 09:14 AM   Modules accepted: Orders

## 2021-11-18 NOTE — Telephone Encounter (Signed)
FYI  Patient called the after hour nurse line on 11/16/21. Per patient, the pharmacy said the script had been cancelled and the wrong dose was on file. Patient will need a new script sent into the pharmacy. Per on call nurse, Patient denies any current symptoms.

## 2021-11-18 NOTE — Telephone Encounter (Signed)
Pharmacy is incorrect.  60mg  dose was sent 11/11/21.  Rx sent again.

## 2021-11-26 ENCOUNTER — Encounter: Payer: Self-pay | Admitting: Family Medicine

## 2021-11-26 ENCOUNTER — Telehealth (INDEPENDENT_AMBULATORY_CARE_PROVIDER_SITE_OTHER): Payer: 59 | Admitting: Family Medicine

## 2021-11-26 DIAGNOSIS — F902 Attention-deficit hyperactivity disorder, combined type: Secondary | ICD-10-CM

## 2021-11-26 MED ORDER — JORNAY PM 60 MG PO CP24: 60.0000 mg | ORAL_CAPSULE | Freq: Every day | ORAL | 0 refills | Status: DC

## 2021-11-26 MED ORDER — JORNAY PM 60 MG PO CP24: ORAL_CAPSULE | ORAL | 0 refills | Status: DC

## 2021-11-26 MED ORDER — JORNAY PM 60 MG PO CP24
ORAL_CAPSULE | ORAL | 0 refills | Status: DC
Start: 2021-11-26 — End: 2022-01-21

## 2021-11-26 NOTE — Progress Notes (Signed)
Joel Ferguson - 26 y.o. male MRN [id number]  Date of birth: 1995/12/26   This visit type was conducted due to national recommendations for restrictions regarding the COVID-19 Pandemic (e.g. social distancing).  This format is felt to be most appropriate for this patient at this time.  All issues noted in this document were discussed and addressed.  No physical exam was performed (except for noted visual exam findings with Video Visits).  I discussed the limitations of evaluation and management by telemedicine and the availability of in person appointments. The patient expressed understanding and agreed to proceed.  I connected withNAME@ on 11/26/21 at  1:10 PM EDT by a video enabled telemedicine application and verified that I am speaking with the correct person using two identifiers.  Present at visit: [name], DO Eliberto Ivory Casados   Patient Location: Home [address] [place] [address]   Provider location:   [facility]  No chief complaint on file.   HPI  Joel Ferguson is a 26 y.o. male who presents via Web designer for a telehealth visit today.  Remains on Jornay 60mg  for management of ADD symptoms.  Denies side effects related to current strength.  Finds that focus and impulsivity are much improved.  Would like to remain at current strength.     ROS:  A comprehensive ROS was completed and negative except as noted per HPI  Past Medical History:  Diagnosis Date   Abscess of knee    Obesity     Past Surgical History:  Procedure Laterality Date   WISDOM TOOTH EXTRACTION      Family History  Problem Relation Age of Onset   Gallstones Mother    Colon polyps Maternal Grandmother    Edema Paternal Grandmother    Pancreatic cancer Other     Social History   Socioeconomic History   Marital status: Married    Spouse name: Not on file   Number of children: 1   Years of education: Not on file   Highest education level: Not on file  Occupational  History   Not on file  Tobacco Use   Smoking status: Never   Smokeless tobacco: Never  Vaping Use   Vaping Use: Never used  Substance and Sexual Activity   Alcohol use: Yes    Comment: OCCASSION'S   Drug use: No   Sexual activity: Not Currently    Birth control/protection: None  Other Topics Concern   Not on file  Social History Narrative   Not on file   Social Determinants of Health   Financial Resource Strain: Not on file  Food Insecurity: Not on file  Transportation Needs: Not on file  Physical Activity: Not on file  Stress: Not on file  Social Connections: Not on file  Intimate Partner Violence: Not on file     Current Outpatient Medications:    clobetasol (TEMOVATE) 0.05 % external solution, Apply 1 application topically 2 (two) times daily. Use on scalp/neck, Disp: 50 mL, Rfl: 2   ketoconazole (NIZORAL) 2 % shampoo, Apply 1 Application topically 2 (two) times a week., Disp: 120 mL, Rfl: 0   Methylphenidate HCl ER, PM, (JORNAY PM) 60 MG CP24, Take 1 capsule daily, Disp: 30 capsule, Rfl: 0   Methylphenidate HCl ER, PM, (JORNAY PM) 60 MG CP24, Take 60 mg by mouth daily., Disp: 30 capsule, Rfl: 0   Methylphenidate HCl ER, PM, (JORNAY PM) 60 MG CP24, Take 1 capsule daily., Disp: 30 capsule, Rfl: 0   nystatin (  MYCOSTATIN/NYSTOP) powder, Apply 1 application topically 3 (three) times daily., Disp: 60 g, Rfl: 1   triamcinolone (KENALOG) 0.1 %, Apply 1 application topically 2 (two) times daily. Use for rash on arms as needed., Disp: 30 g, Rfl: 0   albuterol (VENTOLIN HFA) 108 (90 Base) MCG/ACT inhaler, Inhale 2 puffs into the lungs every 6 (six) hours as needed for wheezing or shortness of breath. (Patient not taking: Reported on 09/30/2021), Disp: 18 g, Rfl: 1  EXAM:  VITALS per patient if applicable: Ht 6\' 2"  (1.88 m)   Wt 289 lb (131.1 kg)   BMI 37.11 kg/m   GENERAL: alert, oriented, appears well and in no acute distress  HEENT: atraumatic, conjunttiva clear, no obvious  abnormalities on inspection of external nose and ears  NECK: normal movements of the head and neck  LUNGS: on inspection no signs of respiratory distress, breathing rate appears normal, no obvious gross SOB, gasping or wheezing  CV: no obvious cyanosis  MS: moves all visible extremities without noticeable abnormality  PSYCH/NEURO: pleasant and cooperative, no obvious depression or anxiety, speech and thought processing grossly intact  ASSESSMENT AND PLAN:  Discussed the following assessment and plan:  ADHD Stable symptoms with Jornay at 60mg  daily.  Will plan to continue this at current strength.  F/u in 3 months.      I discussed the assessment and treatment plan with the patient. The patient was provided an opportunity to ask questions and all were answered. The patient agreed with the plan and demonstrated an understanding of the instructions.   The patient was advised to call back or seek an in-person evaluation if the symptoms worsen or if the condition fails to improve as anticipated.    , DO

## 2021-11-26 NOTE — Assessment & Plan Note (Signed)
Stable symptoms with Jornay at 60mg  daily.  Will plan to continue this at current strength.  F/u in 3 months.

## 2022-01-20 ENCOUNTER — Encounter: Payer: Self-pay | Admitting: Family Medicine

## 2022-01-21 ENCOUNTER — Other Ambulatory Visit: Payer: Self-pay

## 2022-01-22 MED ORDER — JORNAY PM 60 MG PO CP24: 60.0000 mg | ORAL_CAPSULE | Freq: Every day | ORAL | 0 refills | Status: DC

## 2022-03-04 ENCOUNTER — Encounter: Payer: Self-pay | Admitting: Family Medicine

## 2022-04-02 ENCOUNTER — Telehealth (INDEPENDENT_AMBULATORY_CARE_PROVIDER_SITE_OTHER): Payer: Self-pay | Admitting: Family Medicine

## 2022-04-02 ENCOUNTER — Encounter: Payer: Self-pay | Admitting: Family Medicine

## 2022-04-02 VITALS — Ht 74.0 in | Wt 290.0 lb

## 2022-04-02 DIAGNOSIS — H6690 Otitis media, unspecified, unspecified ear: Secondary | ICD-10-CM | POA: Insufficient documentation

## 2022-04-02 DIAGNOSIS — F902 Attention-deficit hyperactivity disorder, combined type: Secondary | ICD-10-CM

## 2022-04-02 MED ORDER — JORNAY PM 60 MG PO CP24: ORAL_CAPSULE | ORAL | 0 refills | Status: DC

## 2022-04-02 MED ORDER — JORNAY PM 60 MG PO CP24: 60.0000 mg | ORAL_CAPSULE | Freq: Every day | ORAL | 0 refills | Status: DC

## 2022-04-02 MED ORDER — AMOXICILLIN-POT CLAVULANATE 875-125 MG PO TABS: 1.0000 | ORAL_TABLET | Freq: Two times a day (BID) | ORAL | 0 refills | Status: DC

## 2022-04-02 MED ORDER — METHYLPHENIDATE HCL 10 MG PO TABS: ORAL_TABLET | ORAL | 0 refills | Status: DC

## 2022-04-02 NOTE — Progress Notes (Signed)
Ear pain x 2 weeks. Hurt since family was ill.

## 2022-04-02 NOTE — Assessment & Plan Note (Signed)
Symptoms consistent with OM.  History of L eustachian tube dysfunction and recent URI symptoms.  Adding course of augmentin.  Recommend increased fluid and addition of flonase daily.

## 2022-04-02 NOTE — Progress Notes (Signed)
Joel Ferguson - 26 y.o. male MRN [id number]  Date of birth: Jan 16, 1996   This visit type was conducted due to national recommendations for restrictions regarding the COVID-19 Pandemic (e.g. social distancing).  This format is felt to be most appropriate for this patient at this time.  All issues noted in this document were discussed and addressed.  No physical exam was performed (except for noted visual exam findings with Video Visits).  I discussed the limitations of evaluation and management by telemedicine and the availability of in person appointments. The patient expressed understanding and agreed to proceed.  I connected withNAME@ on 04/02/22 at 10:30 AM EST by a video enabled telemedicine application and verified that I am speaking with the correct person using two identifiers.  Present at visit: Joel Coombe, DO Joel Ferguson   Patient Location: Home [address] [place] [address]   Provider location:   [facility]  Chief Complaint  Patient presents with   Ear Pain   ADHD    HPI  Joel Ferguson is a 26 y.o. male who presents via audio/video conferencing for a telehealth visit today.  He is following up for ADD.  Reports that Joel Ferguson works pretty well for him but is wearing off early.  Typically lasts until 2pm each day and he no longer notices the effects after that.  He has not had any significant side effect from medication.  Denies chest pain, palpitations or insomnia.   He has had L ear pain for about 2 weeks.  Started after having URI symptoms a few weeks ago.  Pressure and pain sensation.  No drainage form the ear.    ROS:  A comprehensive ROS was completed and negative except as noted per HPI  Past Medical History:  Diagnosis Date   Abscess of knee    Obesity     Past Surgical History:  Procedure Laterality Date   WISDOM TOOTH EXTRACTION      Family History  Problem Relation Age of Onset   Gallstones Mother    Colon polyps Maternal Grandmother     Edema Paternal Grandmother    Pancreatic cancer Other     Social History   Socioeconomic History   Marital status: Married    Spouse name: Not on file   Number of children: 1   Years of education: Not on file   Highest education level: Not on file  Occupational History   Not on file  Tobacco Use   Smoking status: Never   Smokeless tobacco: Never  Vaping Use   Vaping Use: Never used  Substance and Sexual Activity   Alcohol use: Yes    Comment: OCCASSION'S   Drug use: No   Sexual activity: Not Currently    Birth control/protection: None  Other Topics Concern   Not on file  Social History Narrative   Not on file   Social Determinants of Health   Financial Resource Strain: Not on file  Food Insecurity: Not on file  Transportation Needs: Not on file  Physical Activity: Not on file  Stress: Not on file  Social Connections: Not on file  Intimate Partner Violence: Not on file     Current Outpatient Medications:    albuterol (VENTOLIN HFA) 108 (90 Base) MCG/ACT inhaler, Inhale 2 puffs into the lungs every 6 (six) hours as needed for wheezing or shortness of breath., Disp: 18 g, Rfl: 1   amoxicillin-clavulanate (AUGMENTIN) 875-125 MG tablet, Take 1 tablet by mouth 2 (two) times daily., Disp:  20 tablet, Rfl: 0   clobetasol (TEMOVATE) 0.05 % external solution, Apply 1 application topically 2 (two) times daily. Use on scalp/neck, Disp: 50 mL, Rfl: 2   ketoconazole (NIZORAL) 2 % shampoo, Apply 1 Application topically 2 (two) times a week., Disp: 120 mL, Rfl: 0   methylphenidate (RITALIN) 10 MG tablet, Take in the afternoon as needed., Disp: 15 tablet, Rfl: 0   nystatin (MYCOSTATIN/NYSTOP) powder, Apply 1 application topically 3 (three) times daily., Disp: 60 g, Rfl: 1   triamcinolone (KENALOG) 0.1 %, Apply 1 application topically 2 (two) times daily. Use for rash on arms as needed., Disp: 30 g, Rfl: 0   Methylphenidate HCl ER, PM, (JORNAY PM) 60 MG CP24, Take 1 capsule daily.,  Disp: 30 capsule, Rfl: 0   Methylphenidate HCl ER, PM, (JORNAY PM) 60 MG CP24, Take 60 mg by mouth daily. Take 1 capsule daily, Disp: 30 capsule, Rfl: 0   Methylphenidate HCl ER, PM, (JORNAY PM) 60 MG CP24, Take 60 mg by mouth daily., Disp: 30 capsule, Rfl: 0  EXAM:  VITALS per patient if applicable: Ht 6\' 2"  (1.88 m)   Wt 290 lb (131.5 kg)   BMI 37.23 kg/m   GENERAL: alert, oriented, appears well and in no acute distress  HEENT: atraumatic, conjunttiva clear, no obvious abnormalities on inspection of external nose and ears  NECK: normal movements of the head and neck  LUNGS: on inspection no signs of respiratory distress, breathing rate appears normal, no obvious gross SOB, gasping or wheezing  CV: no obvious cyanosis  MS: moves all visible extremities without noticeable abnormality  PSYCH/NEURO: pleasant and cooperative, no obvious depression or anxiety, speech and thought processing grossly intact  ASSESSMENT AND PLAN:  Discussed the following assessment and plan:  ADHD Will leave jornay at current strength.  Adding methylphenidate IR 10mg  as needed in the afternoon.  He will let me know if this is or is not effective for him.   Acute otitis media Symptoms consistent with OM.  History of L eustachian tube dysfunction and recent URI symptoms.  Adding course of augmentin.  Recommend increased fluid and addition of flonase daily.      I discussed the assessment and treatment plan with the patient. The patient was provided an opportunity to ask questions and all were answered. The patient agreed with the plan and demonstrated an understanding of the instructions.   The patient was advised to call back or seek an in-person evaluation if the symptoms worsen or if the condition fails to improve as anticipated.    , DO

## 2022-04-02 NOTE — Assessment & Plan Note (Signed)
Will leave jornay at current strength.  Adding methylphenidate IR 10mg  as needed in the afternoon.  He will let me know if this is or is not effective for him.

## 2022-06-29 ENCOUNTER — Encounter: Payer: Self-pay | Admitting: Family Medicine

## 2022-07-02 ENCOUNTER — Telehealth (INDEPENDENT_AMBULATORY_CARE_PROVIDER_SITE_OTHER): Payer: BLUE CROSS/BLUE SHIELD | Admitting: Family Medicine

## 2022-07-02 ENCOUNTER — Encounter: Payer: Self-pay | Admitting: Family Medicine

## 2022-07-02 VITALS — Ht 74.0 in | Wt 295.0 lb

## 2022-07-02 DIAGNOSIS — R21 Rash and other nonspecific skin eruption: Secondary | ICD-10-CM | POA: Diagnosis not present

## 2022-07-02 DIAGNOSIS — F902 Attention-deficit hyperactivity disorder, combined type: Secondary | ICD-10-CM | POA: Diagnosis not present

## 2022-07-02 DIAGNOSIS — L219 Seborrheic dermatitis, unspecified: Secondary | ICD-10-CM | POA: Diagnosis not present

## 2022-07-02 MED ORDER — JORNAY PM 80 MG PO CP24: ORAL_CAPSULE | ORAL | 0 refills | Status: DC

## 2022-07-02 MED ORDER — ITRACONAZOLE 100 MG PO CAPS: 100.0000 mg | ORAL_CAPSULE | Freq: Two times a day (BID) | ORAL | 0 refills | Status: AC

## 2022-07-02 NOTE — Assessment & Plan Note (Signed)
Increasing Jornay to 80 mg daily.

## 2022-07-02 NOTE — Assessment & Plan Note (Signed)
Symptoms have not responding to typical treatment of steroids and topical antifungals.  Adding course of oral itraconazole 100 mg twice daily x 7 days.  Follow-up in about 4-6 weeks.

## 2022-07-02 NOTE — Progress Notes (Signed)
Joel Ferguson - 27 y.o. male MRN [id number]  Date of birth: 1995-11-13   This visit type was conducted due to national recommendations for restrictions regarding the COVID-19 Pandemic (e.g. social distancing).  This format is felt to be most appropriate for this patient at this time.  All issues noted in this document were discussed and addressed.  No physical exam was performed (except for noted visual exam findings with Video Visits).  I discussed the limitations of evaluation and management by telemedicine and the availability of in person appointments. The patient expressed understanding and agreed to proceed.  I connected withNAME@ on 07/02/22 at 10:30 AM EDT by a video enabled telemedicine application and verified that I am speaking with the correct person using two identifiers.  Present at visit: Joel Nutting, DO Joel Ferguson   Patient Location: Home  [place] [address]   Provider location:   [facility]  Chief Complaint  Patient presents with   Rash   Medication Problem    HPI  Joel Ferguson is a 27 y.o. male who presents via audio/video conferencing for a telehealth visit today.    He would like to try adjustment in his Jornay dose.  Does not feel it 60 mg is effective and addition of low-dose afternoon medication did not really make a difference.  Continues to have increased in seborrheic dermatitis.  Has flaking of the face as well as the scalp.  He has tried topical antifungals and topical steroids.  This was previously diagnosed by dermatology.  Additionally he is having some itching and rash of the groin area.  This is recurrent.   ROS:  A comprehensive ROS was completed and negative except as noted per HPI  Past Medical History:  Diagnosis Date   Abscess of knee    Obesity     Past Surgical History:  Procedure Laterality Date   WISDOM TOOTH EXTRACTION      Family History  Problem Relation Age of Onset   Gallstones Mother    Colon  polyps Maternal Grandmother    Edema Paternal Grandmother    Pancreatic cancer Other     Social History   Socioeconomic History   Marital status: Married    Spouse name: Not on file   Number of children: 1   Years of education: Not on file   Highest education level: Not on file  Occupational History   Not on file  Tobacco Use   Smoking status: Never   Smokeless tobacco: Never  Vaping Use   Vaping Use: Never used  Substance and Sexual Activity   Alcohol use: Yes    Comment: OCCASSION'S   Drug use: No   Sexual activity: Not Currently    Birth control/protection: None  Other Topics Concern   Not on file  Social History Narrative   Not on file   Social Determinants of Health   Financial Resource Strain: Not on file  Food Insecurity: Not on file  Transportation Needs: Not on file  Physical Activity: Not on file  Stress: Not on file  Social Connections: Not on file  Intimate Partner Violence: Not on file     Current Outpatient Medications:    albuterol (VENTOLIN HFA) 108 (90 Base) MCG/ACT inhaler, Inhale 2 puffs into the lungs every 6 (six) hours as needed for wheezing or shortness of breath., Disp: 18 g, Rfl: 1   itraconazole (SPORANOX) 100 MG capsule, Take 1 capsule (100 mg total) by mouth 2 (two) times daily  for 7 days., Disp: 14 capsule, Rfl: 0   ketoconazole (NIZORAL) 2 % shampoo, Apply 1 Application topically 2 (two) times a week., Disp: 120 mL, Rfl: 0   Methylphenidate HCl ER, PM, (JORNAY PM) 80 MG CP24, Take 1 tab po daily, Disp: 30 capsule, Rfl: 0   nystatin (MYCOSTATIN/NYSTOP) powder, Apply 1 application topically 3 (three) times daily., Disp: 60 g, Rfl: 1  EXAM:  VITALS per patient if applicable: Ht 6\' 2"  (1.88 m)   Wt 295 lb (133.8 kg)   BMI 37.88 kg/m   GENERAL: alert, oriented, appears well and in no acute distress  HEENT: atraumatic, conjunttiva clear, no obvious abnormalities on inspection of external nose and ears  NECK: normal movements of the  head and neck  LUNGS: on inspection no signs of respiratory distress, breathing rate appears normal, no obvious gross SOB, gasping or wheezing  CV: no obvious cyanosis  MS: moves all visible extremities without noticeable abnormality  PSYCH/NEURO: pleasant and cooperative, no obvious depression or anxiety, speech and thought processing grossly intact  ASSESSMENT AND PLAN:  Discussed the following assessment and plan:  Seborrheic dermatitis Symptoms have not responding to typical treatment of steroids and topical antifungals.  Adding course of oral itraconazole 100 mg twice daily x 7 days.  Follow-up in about 4-6 weeks.  Rash This did improve with fluconazole previously however returned shortly afterwards.  Will see if course of Sporanox for his seborrheic dermatitis will help with this as well.  We discussed referral to dermatology if not improving.  ADHD Increasing Jornay to 80 mg daily.     I discussed the assessment and treatment plan with the patient. The patient was provided an opportunity to ask questions and all were answered. The patient agreed with the plan and demonstrated an understanding of the instructions.   The patient was advised to call back or seek an in-person evaluation if the symptoms worsen or if the condition fails to improve as anticipated.    Joel Nutting, DO

## 2022-07-02 NOTE — Assessment & Plan Note (Signed)
This did improve with fluconazole previously however returned shortly afterwards.  Will see if course of Sporanox for his seborrheic dermatitis will help with this as well.  We discussed referral to dermatology if not improving.

## 2022-07-14 ENCOUNTER — Encounter: Payer: Self-pay | Admitting: Family Medicine

## 2022-07-17 ENCOUNTER — Telehealth: Payer: Self-pay

## 2022-07-17 NOTE — Telephone Encounter (Signed)
Initiated Prior authorization [id number] 100MG  capsules Via: Covermymeds Case/Key:[id number]  Status: denied as of 07/17/22 Reason:reason not given awaiting fax Notified Pt via: Mychart

## 2022-07-31 ENCOUNTER — Encounter: Payer: Self-pay | Admitting: Family Medicine

## 2022-07-31 MED ORDER — METHYLPHENIDATE HCL 20 MG PO TABS: 20.0000 mg | ORAL_TABLET | Freq: Every day | ORAL | 0 refills | Status: DC

## 2022-07-31 MED ORDER — KETOCONAZOLE 2 % EX SHAM: 1.0000 | MEDICATED_SHAMPOO | CUTANEOUS | 3 refills | Status: DC

## 2022-07-31 MED ORDER — JORNAY PM 60 MG PO CP24: ORAL_CAPSULE | ORAL | 0 refills | Status: DC

## 2022-08-01 MED ORDER — JORNAY PM 60 MG PO CP24: ORAL_CAPSULE | ORAL | 0 refills | Status: DC

## 2022-08-01 MED ORDER — KETOCONAZOLE 2 % EX SHAM: 1.0000 | MEDICATED_SHAMPOO | CUTANEOUS | 3 refills | Status: DC

## 2022-08-01 MED ORDER — METHYLPHENIDATE HCL 20 MG PO TABS: 20.0000 mg | ORAL_TABLET | Freq: Every day | ORAL | 0 refills | Status: DC

## 2022-08-01 NOTE — Telephone Encounter (Signed)
Completed.   CM

## 2022-08-01 NOTE — Addendum Note (Signed)
Addended by: Mammie Lorenzo on: 08/01/2022 12:10 PM   Modules accepted: Orders

## 2022-08-01 NOTE — Addendum Note (Signed)
Addended by: Chalmers Cater on: 08/01/2022 07:13 AM   Modules accepted: Orders

## 2022-09-09 ENCOUNTER — Encounter: Payer: Self-pay | Admitting: Family Medicine

## 2022-12-08 ENCOUNTER — Encounter: Payer: Self-pay | Admitting: Family Medicine

## 2022-12-08 ENCOUNTER — Telehealth (INDEPENDENT_AMBULATORY_CARE_PROVIDER_SITE_OTHER): Payer: BLUE CROSS/BLUE SHIELD | Admitting: Family Medicine

## 2022-12-08 VITALS — Ht 74.0 in | Wt 295.0 lb

## 2022-12-08 DIAGNOSIS — Z0000 Encounter for general adult medical examination without abnormal findings: Secondary | ICD-10-CM

## 2022-12-08 DIAGNOSIS — F902 Attention-deficit hyperactivity disorder, combined type: Secondary | ICD-10-CM

## 2022-12-08 DIAGNOSIS — Z13220 Encounter for screening for lipoid disorders: Secondary | ICD-10-CM

## 2022-12-08 DIAGNOSIS — R400 Somnolence: Secondary | ICD-10-CM

## 2022-12-08 MED ORDER — VYVANSE 30 MG PO CAPS: 30.0000 mg | ORAL_CAPSULE | Freq: Every day | ORAL | 0 refills | Status: DC

## 2022-12-08 NOTE — Assessment & Plan Note (Signed)
Doesn't feel that methylphenidate is a good fit for him.  Will try vyvanse starting at 30mg  daily.  He will let me know if this is not helpful.

## 2022-12-08 NOTE — Progress Notes (Signed)
Joel Ferguson - 27 y.o. male MRN [id number]  Date of birth: [birth year]   This visit type was conducted due to national recommendations for restrictions regarding the COVID-19 Pandemic (e.g. social distancing).  This format is felt to be most appropriate for this patient at this time.  All issues noted in this document were discussed and addressed.  No physical exam was performed (except for noted visual exam findings with Video Visits).  I discussed the limitations of evaluation and management by telemedicine and the availability of in person appointments. The patient expressed understanding and agreed to proceed.  I connected withNAME@ on 12/08/22 at 10:30 AM EDT by a video enabled telemedicine application and verified that I am speaking with the correct person using two identifiers.  Present at visit: [name], DO Eliberto Ivory Saar   Patient Location: Home [address] [place] [address]   Provider location:   [facility]  Chief Complaint  Patient presents with   Anxiety    HPI  Joel Ferguson is a 27 y.o. male who presents via audio/video conferencing for a telehealth visit today.  He would like to discuss changing medication for ADHD.  He has tried [place] but didn't feel that this was lasting him long enough throughout the day.  Added additional methylphenidate in the afternoon however he had increased GI upset with this.  He would like to change to something different.  He is wanting to try Vyvanse as this is covered by his insurance.     ROS:  A comprehensive ROS was completed and negative except as noted per HPI  Past Medical History:  Diagnosis Date   Abscess of knee    Obesity     Past Surgical History:  Procedure Laterality Date   WISDOM TOOTH EXTRACTION      Family History  Problem Relation Age of Onset   Gallstones Mother    Colon polyps Maternal Grandmother    Edema Paternal Grandmother    Pancreatic cancer Other     Social History   Socioeconomic  History   Marital status: Married    Spouse name: Not on file   Number of children: 1   Years of education: Not on file   Highest education level: Not on file  Occupational History   Not on file  Tobacco Use   Smoking status: Never   Smokeless tobacco: Never  Vaping Use   Vaping status: Never Used  Substance and Sexual Activity   Alcohol use: Yes    Comment: OCCASSION'S   Drug use: No   Sexual activity: Not Currently    Birth control/protection: None  Other Topics Concern   Not on file  Social History Narrative   Not on file   Social Determinants of Health   Financial Resource Strain: Not on file  Food Insecurity: Not on file  Transportation Needs: Not on file  Physical Activity: Not on file  Stress: Not on file  Social Connections: Unknown (08/27/2021)   Received from [facility]   Social Network    Social Network: Not on file  Intimate Partner Violence: Unknown (07/19/2021)   Received from [facility]   HITS    Physically Hurt: Not on file    Insult or Talk Down To: Not on file    Threaten Physical Harm: Not on file    Scream or Curse: Not on file     Current Outpatient Medications:    albuterol (VENTOLIN HFA) 108 (90 Base) MCG/ACT inhaler, Inhale 2 puffs  into the lungs every 6 (six) hours as needed for wheezing or shortness of breath., Disp: 18 g, Rfl: 1   ketoconazole (NIZORAL) 2 % shampoo, Apply 1 Application topically 2 (two) times a week., Disp: 120 mL, Rfl: 3   nystatin (MYCOSTATIN/NYSTOP) powder, Apply 1 application topically 3 (three) times daily., Disp: 60 g, Rfl: 1   VYVANSE 30 MG capsule, Take 1 capsule (30 mg total) by mouth daily., Disp: 15 capsule, Rfl: 0  EXAM:  VITALS per patient if applicable: Ht 6\' 2"  (1.88 m)   Wt 295 lb (133.8 kg)   BMI 37.88 kg/m   GENERAL: alert, oriented, appears well and in no acute distress  HEENT: atraumatic, conjunttiva clear, no obvious abnormalities on inspection of external nose and ears  NECK: normal  movements of the head and neck  LUNGS: on inspection no signs of respiratory distress, breathing rate appears normal, no obvious gross SOB, gasping or wheezing  CV: no obvious cyanosis  MS: moves all visible extremities without noticeable abnormality  PSYCH/NEURO: pleasant and cooperative, no obvious depression or anxiety, speech and thought processing grossly intact  ASSESSMENT AND PLAN:  Discussed the following assessment and plan:  ADHD Doesn't feel that methylphenidate is a good fit for him.  Will try vyvanse starting at 30mg  daily.  He will let me know if this is not helpful.   Labs ordered for upcoming annual exam   I discussed the assessment and treatment plan with the patient. The patient was provided an opportunity to ask questions and all were answered. The patient agreed with the plan and demonstrated an understanding of the instructions.   The patient was advised to call back or seek an in-person evaluation if the symptoms worsen or if the condition fails to improve as anticipated.    [name], DO

## 2022-12-08 NOTE — Progress Notes (Signed)
Switch from ritalin due to loss of effects on different dosages.

## 2023-01-06 ENCOUNTER — Encounter: Payer: Self-pay | Admitting: Family Medicine

## 2023-01-07 MED ORDER — VYVANSE 30 MG PO CAPS: 30.0000 mg | ORAL_CAPSULE | Freq: Every day | ORAL | 0 refills | Status: DC

## 2023-01-08 NOTE — Telephone Encounter (Signed)
Patient informed. 

## 2023-01-23 ENCOUNTER — Encounter: Payer: BLUE CROSS/BLUE SHIELD | Admitting: Family Medicine

## 2023-02-10 ENCOUNTER — Encounter: Payer: BLUE CROSS/BLUE SHIELD | Admitting: Family Medicine

## 2023-02-19 ENCOUNTER — Encounter: Payer: Self-pay | Admitting: Family Medicine

## 2023-02-19 ENCOUNTER — Ambulatory Visit (INDEPENDENT_AMBULATORY_CARE_PROVIDER_SITE_OTHER): Payer: Self-pay | Admitting: Family Medicine

## 2023-02-19 VITALS — BP 121/76 | HR 80 | Ht 74.0 in | Wt 316.0 lb

## 2023-02-19 DIAGNOSIS — Z0000 Encounter for general adult medical examination without abnormal findings: Secondary | ICD-10-CM

## 2023-02-19 DIAGNOSIS — L219 Seborrheic dermatitis, unspecified: Secondary | ICD-10-CM

## 2023-02-19 MED ORDER — CLOBETASOL PROPIONATE 0.05 % EX SHAM: MEDICATED_SHAMPOO | CUTANEOUS | 0 refills | Status: DC

## 2023-02-19 MED ORDER — TRIAMCINOLONE ACETONIDE 0.025 % EX OINT: 1.0000 | TOPICAL_OINTMENT | Freq: Two times a day (BID) | CUTANEOUS | 0 refills | Status: DC

## 2023-02-19 MED ORDER — KETOCONAZOLE 2 % EX SHAM: 1.0000 | MEDICATED_SHAMPOO | CUTANEOUS | 3 refills | Status: DC

## 2023-02-19 MED ORDER — JORNAY PM 80 MG PO CP24: ORAL_CAPSULE | ORAL | 0 refills | Status: DC

## 2023-02-19 NOTE — Assessment & Plan Note (Signed)
Well adult Immunizations: UTD.  Screenings: Up-to-date Anticipatory guidance/risk factor reduction: Recommendations per HPI

## 2023-02-19 NOTE — Progress Notes (Signed)
Joel Ferguson - 27 y.o. male MRN [id number]  Date of birth: [birth year]  Subjective Chief Complaint  Patient presents with   Annual Exam    HPI Joel Ferguson is a 27 y.o. male here today for annual exam.  He reports that he is doing pretty well.  Doesn't feel that nizoral alone is effective for seborrheic dermatitis.    Activity level is low to moderate. Goes to the gym once per week.  He feels that diet is pretty good.   Non-smoker.  Occasional EtOH use.   Labs ordered previously but hasn't had these completed yet.   Review of Systems  Constitutional:  Negative for chills, fever, malaise/fatigue and weight loss.  HENT:  Negative for congestion, ear pain and sore throat.   Eyes:  Negative for blurred vision, double vision and pain.  Respiratory:  Negative for cough and shortness of breath.   Cardiovascular:  Negative for chest pain and palpitations.  Gastrointestinal:  Negative for abdominal pain, blood in stool, constipation, heartburn and nausea.  Genitourinary:  Negative for dysuria and urgency.  Musculoskeletal:  Negative for joint pain and myalgias.  Neurological:  Negative for dizziness and headaches.  Endo/Heme/Allergies:  Does not bruise/bleed easily.  Psychiatric/Behavioral:  Negative for depression. The patient is not nervous/anxious and does not have insomnia.     Allergies  Allergen Reactions   Nickel Itching    Past Medical History:  Diagnosis Date   Abscess of knee    Obesity     Past Surgical History:  Procedure Laterality Date   WISDOM TOOTH EXTRACTION      Social History   Socioeconomic History   Marital status: Married    Spouse name: Not on file   Number of children: 1   Years of education: Not on file   Highest education level: Not on file  Occupational History   Not on file  Tobacco Use   Smoking status: Never   Smokeless tobacco: Never  Vaping Use   Vaping status: Never Used  Substance and Sexual Activity   Alcohol use: Yes     Comment: OCCASSION'S   Drug use: No   Sexual activity: Not Currently    Birth control/protection: None  Other Topics Concern   Not on file  Social History Narrative   Not on file   Social Determinants of Health   Financial Resource Strain: Not on file  Food Insecurity: Not on file  Transportation Needs: Not on file  Physical Activity: Not on file  Stress: Not on file  Social Connections: Unknown (08/27/2021)   Received from [facility], [facility]   Social Network    Social Network: Not on file    Family History  Problem Relation Age of Onset   Gallstones Mother    Colon polyps Maternal Grandmother    Edema Paternal Grandmother    Pancreatic cancer Other     Health Maintenance  Topic Date Due   COVID-19 Vaccine (4 - 2023-24 season) 03/07/2023 (Originally 12/14/2022)   INFLUENZA VACCINE  07/13/2023 (Originally 11/13/2022)   Hepatitis C Screening  02/19/2024 (Originally 09/03/2013)   HIV Screening  02/19/2024 (Originally 09/04/2010)   DTaP/Tdap/Td (2 - Td or Tdap) 07/03/2031   HPV VACCINES  Aged Out     ----------------------------------------------------------------------------------------------------------------------------------------------------------------------------------------------------------------- Physical Exam BP 121/76 (BP Location: Left Arm, Patient Position: Sitting, Cuff Size: Large)   Pulse 80   Ht 6\' 2"  (1.88 m)   Wt (!) 316 lb (143.3 kg)   SpO2 97%   BMI  40.57 kg/m   Physical Exam Constitutional:      General: He is not in acute distress. HENT:     Head: Normocephalic and atraumatic.     Right Ear: Tympanic membrane and external ear normal.     Left Ear: Tympanic membrane and external ear normal.  Eyes:     General: No scleral icterus. Neck:     Thyroid: No thyromegaly.  Cardiovascular:     Rate and Rhythm: Normal rate and regular rhythm.     Heart sounds: Normal heart sounds.  Pulmonary:     Effort: Pulmonary effort is normal.      Breath sounds: Normal breath sounds.  Abdominal:     General: Bowel sounds are normal. There is no distension.     Palpations: Abdomen is soft.     Tenderness: There is no abdominal tenderness. There is no guarding.  Musculoskeletal:     Cervical back: Normal range of motion.  Lymphadenopathy:     Cervical: No cervical adenopathy.  Skin:    General: Skin is warm and dry.     Findings: No rash.  Neurological:     Mental Status: He is alert and oriented to person, place, and time.     Cranial Nerves: No cranial nerve deficit.     Motor: No abnormal muscle tone.  Psychiatric:        Mood and Affect: Mood normal.        Behavior: Behavior normal.     ------------------------------------------------------------------------------------------------------------------------------------------------------------------------------------------------------------------- Assessment and Plan  Seborrheic dermatitis Adding clobetasol shampoo.  Use in combination with nizoral shampoo.  Low potency triamcinolone for face.  F/u in 8-10 weeks.   Well adult exam Well adult Immunizations: UTD.  Screenings: Up-to-date Anticipatory guidance/risk factor reduction: Recommendations per HPI   Meds ordered this encounter  Medications   Clobetasol Propionate 0.05 % shampoo    Sig: Use once per day.    Dispense:  118 mL    Refill:  0   triamcinolone (KENALOG) 0.025 % ointment    Sig: Apply 1 Application topically 2 (two) times daily. Use on face as needed.    Dispense:  30 g    Refill:  0   ketoconazole (NIZORAL) 2 % shampoo    Sig: Apply 1 Application topically 2 (two) times a week.    Dispense:  120 mL    Refill:  3   Methylphenidate HCl ER, PM, (JORNAY PM) 80 MG CP24    Sig: Take 1 tab po daily    Dispense:  30 capsule    Refill:  0    No follow-ups on file.    This visit occurred during the SARS-CoV-2 public health emergency.  Safety protocols were in place, including screening questions  prior to the visit, additional usage of staff PPE, and extensive cleaning of exam room while observing appropriate contact time as indicated for disinfecting solutions.

## 2023-02-19 NOTE — Assessment & Plan Note (Signed)
Adding clobetasol shampoo.  Use in combination with nizoral shampoo.  Low potency triamcinolone for face.  F/u in 8-10 weeks.

## 2023-02-19 NOTE — Patient Instructions (Addendum)
Try clobetasol shampoo daily for 2-3 weeks.  Continue ketoconazole shampoo 2-3x per week.   Try triamcinolone cream on face.    Preventive Care 48-27 Years Old, Male Preventive care refers to lifestyle choices and visits with your health care provider that can promote health and wellness. Preventive care visits are also called wellness exams. What can I expect for my preventive care visit? Counseling During your preventive care visit, your health care provider may ask about your: Medical history, including: Past medical problems. Family medical history. Current health, including: Emotional well-being. Home life and relationship well-being. Sexual activity. Lifestyle, including: Alcohol, nicotine or tobacco, and drug use. Access to firearms. Diet, exercise, and sleep habits. Safety issues such as seatbelt and bike helmet use. Sunscreen use. Work and work Astronomer. Physical exam Your health care provider may check your: Height and weight. These may be used to calculate your BMI (body mass index). BMI is a measurement that tells if you are at a healthy weight. Waist circumference. This measures the distance around your waistline. This measurement also tells if you are at a healthy weight and may help predict your risk of certain diseases, such as type 2 diabetes and high blood pressure. Heart rate and blood pressure. Body temperature. Skin for abnormal spots. What immunizations do I need?  Vaccines are usually given at various ages, according to a schedule. Your health care provider will recommend vaccines for you based on your age, medical history, and lifestyle or other factors, such as travel or where you work. What tests do I need? Screening Your health care provider may recommend screening tests for certain conditions. This may include: Lipid and cholesterol levels. Diabetes screening. This is done by checking your blood sugar (glucose) after you have not eaten for a while  (fasting). Hepatitis B test. Hepatitis C test. HIV (human immunodeficiency virus) test. STI (sexually transmitted infection) testing, if you are at risk. Talk with your health care provider about your test results, treatment options, and if necessary, the need for more tests. Follow these instructions at home: Eating and drinking  Eat a healthy diet that includes fresh fruits and vegetables, whole grains, lean protein, and low-fat dairy products. Drink enough fluid to keep your urine pale yellow. Take vitamin and mineral supplements as recommended by your health care provider. Do not drink alcohol if your health care provider tells you not to drink. If you drink alcohol: Limit how much you have to 0-2 drinks a day. Know how much alcohol is in your drink. In the [place] one drink equals one 12 oz bottle of beer (355 mL), one 5 oz glass of wine (148 mL), or one 1 oz glass of hard liquor (44 mL). Lifestyle Brush your teeth every morning and night with fluoride toothpaste. Floss one time each day. Exercise for at least 30 minutes 5 or more days each week. Do not use any products that contain nicotine or tobacco. These products include cigarettes, chewing tobacco, and vaping devices, such as e-cigarettes. If you need help quitting, ask your health care provider. Do not use drugs. If you are sexually active, practice safe sex. Use a condom or other form of protection to prevent STIs. Find healthy ways to manage stress, such as: Meditation, yoga, or listening to music. Journaling. Talking to a trusted person. Spending time with friends and family. Minimize exposure to UV radiation to reduce your risk of skin cancer. Safety Always wear your seat belt while driving or riding in a vehicle. Do not  drive: If you have been drinking alcohol. Do not ride with someone who has been drinking. If you have been using any mind-altering substances or drugs. While texting. When you are tired or  distracted. Wear a helmet and other protective equipment during sports activities. If you have firearms in your house, make sure you follow all gun safety procedures. Seek help if you have been physically or sexually abused. What's next? Go to your health care provider once a year for an annual wellness visit. Ask your health care provider how often you should have your eyes and teeth checked. Stay up to date on all vaccines. This information is not intended to replace advice given to you by your health care provider. Make sure you discuss any questions you have with your health care provider. Document Revised: 09/26/2020 Document Reviewed: 09/26/2020 Elsevier Patient Education  2024 [employer].

## 2023-02-20 LAB — CMP14+EGFR
ALT: 43 [IU]/L (ref 0–44)
AST: 25 [IU]/L (ref 0–40)
Albumin: 4.5 g/dL (ref 4.3–5.2)
Alkaline Phosphatase: 70 [IU]/L (ref 44–121)
BUN/Creatinine Ratio: 12 (ref 9–20)
BUN: 11 mg/dL (ref 6–20)
Bilirubin Total: 0.3 mg/dL (ref 0.0–1.2)
CO2: 22 mmol/L (ref 20–29)
Calcium: 9.1 mg/dL (ref 8.7–10.2)
Chloride: 102 mmol/L (ref 96–106)
Creatinine, Ser: 0.89 mg/dL (ref 0.76–1.27)
Globulin, Total: 2.3 g/dL (ref 1.5–4.5)
Glucose: 86 mg/dL (ref 70–99)
Potassium: 4.2 mmol/L (ref 3.5–5.2)
Sodium: 139 mmol/L (ref 134–144)
Total Protein: 6.8 g/dL (ref 6.0–8.5)
eGFR: 120 mL/min/{1.73_m2} (ref >59)

## 2023-02-20 LAB — LIPID PANEL
Chol/HDL Ratio: 5 {ratio} (ref 0.0–5.0)
Cholesterol, Total: 156 mg/dL (ref 100–199)
HDL: 31 mg/dL — ABNORMAL LOW (ref >39)
LDL Chol Calc (NIH): 101 mg/dL — ABNORMAL HIGH (ref 0–99)
Triglycerides: 130 mg/dL (ref 0–149)
VLDL Cholesterol Cal: 24 mg/dL (ref 5–40)

## 2023-02-20 LAB — TSH: TSH: 1.88 u[IU]/mL (ref 0.450–4.500)

## 2023-02-20 LAB — CBC WITH DIFFERENTIAL/PLATELET
Basophils Absolute: 0.1 10*3/uL (ref 0.0–0.2)
Basos: 1 %
EOS (ABSOLUTE): 0.3 10*3/uL (ref 0.0–0.4)
Eos: 3 %
Hematocrit: 45.3 % (ref 37.5–51.0)
Hemoglobin: 14.4 g/dL (ref 13.0–17.7)
Immature Grans (Abs): 0 10*3/uL (ref 0.0–0.1)
Immature Granulocytes: 0 %
Lymphocytes Absolute: 2 10*3/uL (ref 0.7–3.1)
Lymphs: 21 %
MCH: 28.2 pg (ref 26.6–33.0)
MCHC: 31.8 g/dL (ref 31.5–35.7)
MCV: 89 fL (ref 79–97)
Monocytes Absolute: 0.8 10*3/uL (ref 0.1–0.9)
Monocytes: 8 %
Neutrophils Absolute: 6.5 10*3/uL (ref 1.4–7.0)
Neutrophils: 67 %
Platelets: 257 10*3/uL (ref 150–450)
RBC: 5.1 x10E6/uL (ref 4.14–5.80)
RDW: 12.7 % (ref 11.6–15.4)
WBC: 9.6 10*3/uL (ref 3.4–10.8)

## 2023-02-20 LAB — TESTOSTERONE: Testosterone: 276 ng/dL (ref 264–916)

## 2023-02-22 ENCOUNTER — Encounter: Payer: Self-pay | Admitting: Family Medicine

## 2023-02-24 MED ORDER — CLOBETASOL PROPIONATE 0.05 % EX SHAM: MEDICATED_SHAMPOO | CUTANEOUS | 0 refills | Status: DC

## 2023-02-24 MED ORDER — ITRACONAZOLE 100 MG PO CAPS: 200.0000 mg | ORAL_CAPSULE | Freq: Every day | ORAL | 0 refills | Status: AC

## 2023-03-11 MED ORDER — TERBINAFINE HCL 250 MG PO TABS: 250.0000 mg | ORAL_TABLET | Freq: Every day | ORAL | 0 refills | Status: DC

## 2023-03-11 NOTE — Addendum Note (Signed)
Addended by: Mammie Lorenzo on: 03/11/2023 03:08 PM   Modules accepted: Orders

## 2023-03-30 ENCOUNTER — Other Ambulatory Visit: Payer: Self-pay | Admitting: Family Medicine

## 2023-03-30 MED ORDER — TERBINAFINE HCL 250 MG PO TABS: 250.0000 mg | ORAL_TABLET | Freq: Every day | ORAL | 0 refills | Status: AC

## 2023-04-22 ENCOUNTER — Encounter: Payer: Self-pay | Admitting: Family Medicine

## 2023-04-23 ENCOUNTER — Ambulatory Visit: Payer: Self-pay | Admitting: Family Medicine

## 2023-05-04 ENCOUNTER — Encounter: Payer: Self-pay | Admitting: Family Medicine

## 2023-05-05 MED ORDER — LISDEXAMFETAMINE DIMESYLATE 30 MG PO CAPS: 30.0000 mg | ORAL_CAPSULE | Freq: Every day | ORAL | 0 refills | Status: DC

## 2023-06-05 MED ORDER — LISDEXAMFETAMINE DIMESYLATE 30 MG PO CAPS: 30.0000 mg | ORAL_CAPSULE | Freq: Every day | ORAL | 0 refills | Status: DC

## 2023-06-05 NOTE — Addendum Note (Signed)
 Addended by: Mammie Lorenzo on: 06/05/2023 12:24 PM   Modules accepted: Orders

## 2023-06-22 ENCOUNTER — Other Ambulatory Visit: Payer: Self-pay | Admitting: Medical-Surgical

## 2023-06-22 MED ORDER — JORNAY PM 60 MG PO CP24: 60.0000 mg | ORAL_CAPSULE | Freq: Every day | ORAL | 0 refills | Status: DC

## 2023-06-22 NOTE — Progress Notes (Signed)
 Vyvanse discontinued per patient message request and inability to get the medication from the pharmacy. Ophelia Charter PM 60mg  daily sent to the pharmacy. Patient notified via MyChart message.   ___________________________________________ Thayer Ohm, DNP, APRN, FNP-BC Primary Care and Sports Medicine [facility] [place]

## 2023-07-10 ENCOUNTER — Telehealth: Payer: Self-pay

## 2023-07-10 ENCOUNTER — Other Ambulatory Visit (HOSPITAL_COMMUNITY): Payer: Self-pay

## 2023-07-10 NOTE — Telephone Encounter (Signed)
 Re-routing

## 2023-07-10 NOTE — Telephone Encounter (Signed)
 Pharmacy Patient Advocate Encounter   Received notification from Patient Advice Request messages that prior authorization for Jornay PM 60MG  er capsules is required/requested.   Insurance verification completed.   The patient is insured through [facility] .   Per test claim: PA required; PA submitted to above mentioned insurance via CoverMyMeds Key/confirmation #/EOC [facility] Status is pending

## 2023-07-10 NOTE — Telephone Encounter (Signed)
 PA request has been Submitted. New Encounter has been or will be created for follow up. For additional info see Pharmacy Prior Auth telephone encounter from 07/10/2023.

## 2023-07-14 NOTE — Telephone Encounter (Signed)
 Marland Kitchen

## 2023-08-11 ENCOUNTER — Other Ambulatory Visit: Payer: Self-pay | Admitting: Medical-Surgical

## 2023-08-11 ENCOUNTER — Other Ambulatory Visit: Payer: Self-pay | Admitting: Family Medicine

## 2023-08-12 MED ORDER — TRIAMCINOLONE ACETONIDE 0.025 % EX OINT: 1.0000 | TOPICAL_OINTMENT | Freq: Two times a day (BID) | CUTANEOUS | 0 refills | Status: AC

## 2023-08-12 MED ORDER — JORNAY PM 60 MG PO CP24: 60.0000 mg | ORAL_CAPSULE | Freq: Every day | ORAL | 0 refills | Status: DC

## 2023-08-12 MED ORDER — CLOBETASOL PROPIONATE 0.05 % EX SHAM: MEDICATED_SHAMPOO | CUTANEOUS | 0 refills | Status: AC

## 2023-08-12 NOTE — Telephone Encounter (Signed)
 Requesting rx rf of  Methylphenidate   HCL 60mg   Last written 06/22/2023 by joy jessup,NP  Last OV 02/19/2023 Upcoming appt = none

## 2023-08-12 NOTE — Telephone Encounter (Signed)
 Requesting rx rf of  Triamcinolone  0.025% ointment  Last written 02/19/2023 And  Clobetasol  propionate 0.05% shampoo Last written 02/24/2023 Last OV 02/19/2023 Upcoming appt = none

## 2023-08-13 ENCOUNTER — Telehealth: Payer: Self-pay

## 2023-08-13 ENCOUNTER — Other Ambulatory Visit (HOSPITAL_COMMUNITY): Payer: Self-pay

## 2023-08-13 NOTE — Telephone Encounter (Signed)
 Pharmacy Patient Advocate Encounter   Received notification from Pt Calls Messages that prior authorization for Clobetasol  shampoo is required/requested.   Insurance verification completed.   The patient is insured through [facility] .   Per test claim: PA required; PA submitted to above mentioned insurance via CoverMyMeds Key/confirmation #/EOC [id number] Status is pending Patient has plan benefit exclusion

## 2023-08-13 NOTE — Telephone Encounter (Signed)
 Pharmacy Patient Advocate Encounter  Received notification from [facility] that Prior Authorization for Clobetasol  has been DENIED.  No reason given; No denial letter received via Fax or CMM. It has been requested and will be uploaded to the media tab once received.   PA #/Case ID/Reference #: [id number]

## 2023-08-28 ENCOUNTER — Encounter: Payer: Self-pay | Admitting: Family Medicine

## 2023-09-02 MED ORDER — KETOCONAZOLE 2 % EX SHAM: 1.0000 | MEDICATED_SHAMPOO | CUTANEOUS | 3 refills | Status: AC

## 2023-09-02 MED ORDER — JORNAY PM 60 MG PO CP24: 60.0000 mg | ORAL_CAPSULE | Freq: Every day | ORAL | 0 refills | Status: DC

## 2023-10-01 ENCOUNTER — Telehealth: Payer: Self-pay | Admitting: Physician Assistant

## 2023-10-01 DIAGNOSIS — H00011 Hordeolum externum right upper eyelid: Secondary | ICD-10-CM

## 2023-10-01 MED ORDER — POLYMYXIN B-TRIMETHOPRIM 10000-0.1 UNIT/ML-% OP SOLN: OPHTHALMIC | 0 refills | Status: AC

## 2023-10-01 NOTE — Progress Notes (Signed)
 I have spent 5 minutes in review of e-visit questionnaire, review and updating patient chart, medical decision making and response to patient.   Piedad Climes, PA-C

## 2023-10-01 NOTE — Progress Notes (Signed)
  E-Visit for Stye   We are sorry that you are not feeling well. Here is how we plan to help!  Based on what you have shared with me it looks like you have a stye.  A stye is an inflammation of the eyelid.  It is often a red, painful lump near the edge of the eyelid that may look like a boil or a pimple.  A stye develops when an infection occurs at the base of an eyelash.   We have made appropriate suggestions for you based upon your presentation: Simple styes can be treated without medical intervention.  Most styes either resolve spontaneously or resolve with simple home treatment by applying warm compresses or heated washcloth to the stye for about 10-15 minutes three to four times a day. This causes the stye to drain and resolve. Giving moderate tenderness, I have added on an antibiotic drop as a precaution. Please use as directed.  HOME CARE:  Wash your hands often! Let the stye open on its own. Don't squeeze or open it. Don't rub your eyes. This can irritate your eyes and let in bacteria.  If you need to touch your eyes, wash your hands first. Don't wear eye makeup or contact lenses until the area has healed.  GET HELP RIGHT AWAY IF:  Your symptoms do not improve. You develop blurred or loss of vision. Your symptoms worsen (increased discharge, pain or redness).   Thank you for choosing an e-visit.  Your e-visit answers were reviewed by a board certified advanced clinical practitioner to complete your personal care plan. Depending upon the condition, your plan could have included both over the counter or prescription medications.  Please review your pharmacy choice. Make sure the pharmacy is open so you can pick up prescription now. If there is a problem, you may contact your provider through [employer] and have the prescription routed to another pharmacy.  Your safety is important to us . If you have drug allergies check your prescription carefully.   For the next 24 hours  you can use MyChart to ask questions about today's visit, request a non-urgent call back, or ask for a work or school excuse. You will get an email in the next two days asking about your experience. I hope that your e-visit has been valuable and will speed your recovery.

## 2023-10-29 ENCOUNTER — Encounter: Payer: Self-pay | Admitting: Family Medicine

## 2023-10-29 ENCOUNTER — Telehealth (INDEPENDENT_AMBULATORY_CARE_PROVIDER_SITE_OTHER): Payer: Self-pay | Admitting: Family Medicine

## 2023-10-29 VITALS — Ht 74.0 in | Wt 302.0 lb

## 2023-10-29 DIAGNOSIS — G4733 Obstructive sleep apnea (adult) (pediatric): Secondary | ICD-10-CM | POA: Insufficient documentation

## 2023-10-29 MED ORDER — ZEPBOUND 5 MG/0.5ML ~~LOC~~ SOAJ: 5.0000 mg | SUBCUTANEOUS | 0 refills | Status: DC

## 2023-10-29 MED ORDER — ZEPBOUND 2.5 MG/0.5ML ~~LOC~~ SOAJ: 2.5000 mg | SUBCUTANEOUS | 0 refills | Status: DC

## 2023-10-29 NOTE — Assessment & Plan Note (Signed)
 Will add Zepbound  2.5 mg initially with titration to 5 mg after 1 month for management of his sleep apnea as well as chronic obesity.  He will incorporate continued dietary changes as well as exercise program maximize his weight loss.  He has no contraindications to GLP-1 medications.

## 2023-10-29 NOTE — Progress Notes (Signed)
 Joel Ferguson - 28 y.o. male MRN [id number]  Date of birth: [birth year]   All issues noted in this document were discussed and addressed.  No physical exam was performed (except for noted visual exam findings with Video Visits).  I discussed the limitations of evaluation and management by telemedicine and the availability of in person appointments. The patient expressed understanding and agreed to proceed.  I connected withNAME@ on 10/29/23 at  3:10 PM EDT by a video enabled telemedicine application and verified that I am speaking with the correct person using two identifiers.  Present at visit: Velma Ku, DO Massie Ciavarella   Patient Location: Home [address] [place] [address]   Provider location:   [facility]  No chief complaint on file.   HPI  Joel Ferguson is a 28 y.o. male who presents via Web designer for a telehealth visit today.  He like to discuss adding Zepbound  to help with treatment of his sleep apnea as well as weight loss.  He has history of obstructive sleep apnea and has been seen by pulmonology.  His last sleep study was in 2022 showing AHI of 11.  He did have titration study but was never able to tolerate CPAP therapy.  Body mass index is 38.77 kg/m.  I reminded her that placing her at regional   ROS:  A comprehensive ROS was completed and negative except as noted per HPI  Past Medical History:  Diagnosis Date   Abscess of knee    Obesity     Past Surgical History:  Procedure Laterality Date   WISDOM TOOTH EXTRACTION      Family History  Problem Relation Age of Onset   Gallstones Mother    Colon polyps Maternal Grandmother    Edema Paternal Grandmother    Pancreatic cancer Other     Social History   Socioeconomic History   Marital status: Married    Spouse name: Not on file   Number of children: 1   Years of education: Not on file   Highest education level: Not on file  Occupational History   Not on file  Tobacco Use    Smoking status: Never   Smokeless tobacco: Never  Vaping Use   Vaping status: Never Used  Substance and Sexual Activity   Alcohol use: Yes    Comment: OCCASSION'S   Drug use: No   Sexual activity: Not Currently    Birth control/protection: None  Other Topics Concern   Not on file  Social History Narrative   Not on file   Social Drivers of Health   Financial Resource Strain: Not on file  Food Insecurity: Not on file  Transportation Needs: Not on file  Physical Activity: Not on file  Stress: Not on file  Social Connections: Unknown (08/27/2021)   [facility]   Social Network    Social Network: Not on file  Intimate Partner Violence: Unknown (07/19/2021)   Received from [facility]   HITS    Physically Hurt: Not on file    Insult or Talk Down To: Not on file    Threaten Physical Harm: Not on file    Scream or Curse: Not on file     Current Outpatient Medications:    tirzepatide  (ZEPBOUND ) 2.5 MG/0.5ML Pen, Inject 2.5 mg into the skin once a week., Disp: 2 mL, Rfl: 0   tirzepatide  (ZEPBOUND ) 5 MG/0.5ML Pen, Inject 5 mg into the skin once a week., Disp: 2 mL, Rfl: 0  albuterol  (VENTOLIN  HFA) 108 (90 Base) MCG/ACT inhaler, Inhale 2 puffs into the lungs every 6 (six) hours as needed for wheezing or shortness of breath. (Patient not taking: Reported on 02/19/2023), Disp: 18 g, Rfl: 1   Clobetasol  Propionate 0.05 % shampoo, Use once per day., Disp: 118 mL, Rfl: 0   ketoconazole  (NIZORAL ) 2 % shampoo, Apply 1 Application topically 2 (two) times a week., Disp: 120 mL, Rfl: 3   Methylphenidate  HCl ER, PM, (JORNAY PM ) 60 MG CP24, Take 1 capsule (60 mg total) by mouth daily., Disp: 30 capsule, Rfl: 0   nystatin  (MYCOSTATIN /NYSTOP ) powder, Apply 1 application topically 3 (three) times daily. (Patient not taking: Reported on 02/19/2023), Disp: 60 g, Rfl: 1   terbinafine  (LAMISIL ) 250 MG tablet, Take 1 tablet (250 mg total) by mouth daily., Disp: 14 tablet, Rfl: 0    triamcinolone  (KENALOG ) 0.025 % ointment, Apply 1 Application topically 2 (two) times daily. Use on face as needed., Disp: 30 g, Rfl: 0   trimethoprim -polymyxin b  (POLYTRIM ) ophthalmic solution, Apply 1-2 drops into affected eye QID x 5 days., Disp: 10 mL, Rfl: 0  EXAM:  VITALS per patient if applicable: Ht 6' 2 (1.88 m)   Wt (!) 302 lb (137 kg)   BMI 38.77 kg/m   GENERAL: alert, oriented, appears well and in no acute distress  HEENT: atraumatic, conjunttiva clear, no obvious abnormalities on inspection of external nose and ears  NECK: normal movements of the head and neck  LUNGS: on inspection no signs of respiratory distress, breathing rate appears normal, no obvious gross SOB, gasping or wheezing  CV: no obvious cyanosis  MS: moves all visible extremities without noticeable abnormality  PSYCH/NEURO: pleasant and cooperative, no obvious depression or anxiety, speech and thought processing grossly intact  ASSESSMENT AND PLAN:  Discussed the following assessment and plan:  OSA (obstructive sleep apnea) Will add Zepbound  2.5 mg initially with titration to 5 mg after 1 month for management of his sleep apnea as well as chronic obesity.  He will incorporate continued dietary changes as well as exercise program maximize his weight loss.  He has no contraindications to GLP-1 medications.      I discussed the assessment and treatment plan with the patient. The patient was provided an opportunity to ask questions and all were answered. The patient agreed with the plan and demonstrated an understanding of the instructions.   The patient was advised to call back or seek an in-person evaluation if the symptoms worsen or if the condition fails to improve as anticipated.   Velma Ku, DO

## 2023-11-09 ENCOUNTER — Other Ambulatory Visit: Payer: Self-pay

## 2023-11-09 ENCOUNTER — Encounter: Payer: Self-pay | Admitting: Family Medicine

## 2023-11-09 ENCOUNTER — Other Ambulatory Visit: Payer: Self-pay | Admitting: Family Medicine

## 2023-11-09 DIAGNOSIS — G4733 Obstructive sleep apnea (adult) (pediatric): Secondary | ICD-10-CM

## 2023-11-09 MED ORDER — JORNAY PM 60 MG PO CP24: 60.0000 mg | ORAL_CAPSULE | Freq: Every day | ORAL | 0 refills | Status: DC

## 2023-11-09 NOTE — Telephone Encounter (Signed)
 Last fill 09/02/23 last visit 10/29/23 for OSA no upcoming visits.

## 2023-11-10 ENCOUNTER — Other Ambulatory Visit (HOSPITAL_COMMUNITY): Payer: Self-pay

## 2023-11-10 ENCOUNTER — Telehealth: Payer: Self-pay

## 2023-11-10 NOTE — Telephone Encounter (Signed)
 Pharmacy Patient Advocate Encounter  Received notification from [facility] that Prior Authorization for Zepbound  2.5mg /0.59ml has been DENIED.  Full denial letter will be uploaded to the media tab. See denial reason below.   PA #/Case ID/Reference #: [id number]

## 2023-11-13 NOTE — Telephone Encounter (Signed)
 Last read by Massie Schiller at 11:48AM on 11/13/2023.

## 2023-11-13 NOTE — Addendum Note (Signed)
 Addended by: CURTIS DEBBY PARAS on: 11/13/2023 11:45 AM   Modules accepted: Orders

## 2023-11-13 NOTE — Telephone Encounter (Signed)

## 2023-11-28 ENCOUNTER — Encounter: Payer: Self-pay | Admitting: Family Medicine

## 2023-12-04 MED ORDER — AMPHETAMINE-DEXTROAMPHET ER 20 MG PO CP24: 20.0000 mg | ORAL_CAPSULE | ORAL | 0 refills | Status: DC

## 2023-12-04 MED ORDER — AMPHETAMINE-DEXTROAMPHETAMINE 10 MG PO TABS: 10.0000 mg | ORAL_TABLET | Freq: Every day | ORAL | 0 refills | Status: DC

## 2023-12-08 ENCOUNTER — Telehealth: Payer: Self-pay | Admitting: Pharmacy Technician

## 2023-12-08 ENCOUNTER — Other Ambulatory Visit (HOSPITAL_COMMUNITY): Payer: Self-pay

## 2023-12-08 NOTE — Telephone Encounter (Addendum)
 Pharmacy Patient Advocate Encounter   Received notification from Onbase that prior authorization for Amphetamine -dextroamphetamine  10mg  tabs is required/requested.   Insurance verification completed.   The patient is insured through [facility] [employer] .   Per test claim: PA required; PA submitted to above mentioned insurance via Latent Key/confirmation #/EOC [id number] Status is pending

## 2023-12-08 NOTE — Telephone Encounter (Signed)
 Pharmacy Patient Advocate Encounter   Received notification from Onbase that prior authorization for Amphetamine -Dextroamphet ER 20MG  er capsules  is required/requested.   Insurance verification completed.   The patient is insured through [facility] .   Per test claim: PA required; PA submitted to above mentioned insurance via Latent Key/confirmation #/EOC [id number] Status is pending

## 2023-12-09 ENCOUNTER — Other Ambulatory Visit (HOSPITAL_COMMUNITY): Payer: Self-pay

## 2023-12-09 NOTE — Telephone Encounter (Signed)
 Pharmacy Patient Advocate Encounter  Received notification from [facility] that Prior Authorization for Amphetamine -dextroamphetamine  10mg  tabs  has been APPROVED from 12/09/23 to 12/08/24. Ran test claim, Copay is $8.54. This test claim was processed through [facility]- copay amounts may vary at other pharmacies due to pharmacy/plan contracts, or as the patient moves through the different stages of their insurance plan.   PA #/Case ID/Reference #: [id number]

## 2023-12-09 NOTE — Telephone Encounter (Signed)
 Pharmacy Patient Advocate Encounter  Received notification from [facility] that Prior Authorization for Amphetamine -Dextroamphet ER 20MG  er capsules  has been APPROVED from 12/09/23 to 12/08/24. Ran test claim, Copay is $15. This test claim was processed through [facility]- copay amounts may vary at other pharmacies due to pharmacy/plan contracts, or as the patient moves through the different stages of their insurance plan.   PA #/Case ID/Reference #: [id number]

## 2023-12-11 MED ORDER — METHYLPHENIDATE HCL ER (CD) 40 MG PO CPCR: 40.0000 mg | ORAL_CAPSULE | ORAL | 0 refills | Status: DC

## 2023-12-11 MED ORDER — METHYLPHENIDATE HCL ER (CD) 60 MG PO CPCR: 60.0000 mg | ORAL_CAPSULE | ORAL | 0 refills | Status: AC

## 2023-12-11 NOTE — Addendum Note (Signed)
 Addended by: Surie Suchocki E on: 12/11/2023 12:20 PM   Modules accepted: Orders

## 2023-12-15 ENCOUNTER — Other Ambulatory Visit (HOSPITAL_COMMUNITY): Payer: Self-pay

## 2024-02-22 ENCOUNTER — Encounter: Payer: Self-pay | Admitting: Family Medicine

## 2024-02-22 ENCOUNTER — Ambulatory Visit (INDEPENDENT_AMBULATORY_CARE_PROVIDER_SITE_OTHER): Payer: Self-pay | Admitting: Family Medicine

## 2024-02-22 VITALS — BP 113/72 | HR 75 | Ht 74.0 in | Wt 301.0 lb

## 2024-02-22 DIAGNOSIS — R400 Somnolence: Secondary | ICD-10-CM

## 2024-02-22 DIAGNOSIS — Z0000 Encounter for general adult medical examination without abnormal findings: Secondary | ICD-10-CM

## 2024-02-22 MED ORDER — ZEPBOUND 5 MG/0.5ML ~~LOC~~ SOAJ: 5.0000 mg | SUBCUTANEOUS | 0 refills | Status: DC

## 2024-02-22 MED ORDER — ZEPBOUND 2.5 MG/0.5ML ~~LOC~~ SOAJ: 2.5000 mg | SUBCUTANEOUS | 0 refills | Status: DC

## 2024-02-22 NOTE — Assessment & Plan Note (Signed)
 Not currently using CPAP due to intolerance.  Recommend zepbound .  Will try to get this approved for him.

## 2024-02-22 NOTE — Assessment & Plan Note (Signed)
 Well adult Orders Placed This Encounter  Procedures   CMP14+EGFR   CBC with Differential/Platelet   Lipid Panel With LDL/HDL Ratio   HgB A1c   TSH   B12   Ferritin   Sed Rate (ESR)   C-reactive protein    Immunizations: UTD.  Screenings: Up-to-date Anticipatory guidance/risk factor reduction: Recommendations per HPI

## 2024-02-22 NOTE — Patient Instructions (Signed)
 Preventive Care 11-28 Years Old, Male Preventive care refers to lifestyle choices and visits with your health care provider that can promote health and wellness. Preventive care visits are also called wellness exams. What can I expect for my preventive care visit? Counseling During your preventive care visit, your health care provider may ask about your: Medical history, including: Past medical problems. Family medical history. Current health, including: Emotional well-being. Home life and relationship well-being. Sexual activity. Lifestyle, including: Alcohol, nicotine or tobacco, and drug use. Access to firearms. Diet, exercise, and sleep habits. Safety issues such as seatbelt and bike helmet use. Sunscreen use. Work and work Astronomer. Physical exam Your health care provider may check your: Height and weight. These may be used to calculate your BMI (body mass index). BMI is a measurement that tells if you are at a healthy weight. Waist circumference. This measures the distance around your waistline. This measurement also tells if you are at a healthy weight and may help predict your risk of certain diseases, such as type 2 diabetes and high blood pressure. Heart rate and blood pressure. Body temperature. Skin for abnormal spots. What immunizations do I need?  Vaccines are usually given at various ages, according to a schedule. Your health care provider will recommend vaccines for you based on your age, medical history, and lifestyle or other factors, such as travel or where you work. What tests do I need? Screening Your health care provider may recommend screening tests for certain conditions. This may include: Lipid and cholesterol levels. Diabetes screening. This is done by checking your blood sugar (glucose) after you have not eaten for a while (fasting). Hepatitis B test. Hepatitis C test. HIV (human immunodeficiency virus) test. STI (sexually transmitted infection)  testing, if you are at risk. Talk with your health care provider about your test results, treatment options, and if necessary, the need for more tests. Follow these instructions at home: Eating and drinking  Eat a healthy diet that includes fresh fruits and vegetables, whole grains, lean protein, and low-fat dairy products. Drink enough fluid to keep your urine pale yellow. Take vitamin and mineral supplements as recommended by your health care provider. Do not drink alcohol if your health care provider tells you not to drink. If you drink alcohol: Limit how much you have to 0-2 drinks a day. Know how much alcohol is in your drink. In the [place] one drink equals one 12 oz bottle of beer (355 mL), one 5 oz glass of wine (148 mL), or one 1 oz glass of hard liquor (44 mL). Lifestyle Brush your teeth every morning and night with fluoride toothpaste. Floss one time each day. Exercise for at least 30 minutes 5 or more days each week. Do not use any products that contain nicotine or tobacco. These products include cigarettes, chewing tobacco, and vaping devices, such as e-cigarettes. If you need help quitting, ask your health care provider. Do not use drugs. If you are sexually active, practice safe sex. Use a condom or other form of protection to prevent STIs. Find healthy ways to manage stress, such as: Meditation, yoga, or listening to music. Journaling. Talking to a trusted person. Spending time with friends and family. Minimize exposure to UV radiation to reduce your risk of skin cancer. Safety Always wear your seat belt while driving or riding in a vehicle. Do not drive: If you have been drinking alcohol. Do not ride with someone who has been drinking. If you have been using any mind-altering substances  or drugs. While texting. When you are tired or distracted. Wear a helmet and other protective equipment during sports activities. If you have firearms in your house, make sure you  follow all gun safety procedures. Seek help if you have been physically or sexually abused. What's next? Go to your health care provider once a year for an annual wellness visit. Ask your health care provider how often you should have your eyes and teeth checked. Stay up to date on all vaccines. This information is not intended to replace advice given to you by your health care provider. Make sure you discuss any questions you have with your health care provider. Document Revised: 09/26/2020 Document Reviewed: 09/26/2020 Elsevier Patient Education  2024 [employer].

## 2024-02-22 NOTE — Progress Notes (Signed)
 Joel Ferguson - 28 y.o. male MRN [id number]  Date of birth: 1995/12/29  Subjective Chief Complaint  Patient presents with   Weight Loss   ADHD    HPI Joel Ferguson is a 28 y.o. male here today for annual exam.   He has been using Jornay for ADHD.  This has worked well, however insurance is no longer covering this.  He has tried other medications that were also not covered or not very effective for him.   He also feels that wounds do not heal as effectively as they have in the past such as scrapes and cuts on his legs.  He has had difficulty with weight loss.  His activity level is moderate.  He does go to the gym regularly.  He feels that diet is pretty good.  He is tracking calories. .    History of OSA but is not using CPAP as he did not tolerate.   He denies use of nicotine products.  Rare EtOH use.   Review of Systems  Constitutional:  Negative for chills, fever, malaise/fatigue and weight loss.  HENT:  Negative for congestion, ear pain and sore throat.   Eyes:  Negative for blurred vision, double vision and pain.  Respiratory:  Negative for cough and shortness of breath.   Cardiovascular:  Negative for chest pain and palpitations.  Gastrointestinal:  Negative for abdominal pain, blood in stool, constipation, heartburn and nausea.  Genitourinary:  Negative for dysuria and urgency.  Musculoskeletal:  Negative for joint pain and myalgias.  Neurological:  Negative for dizziness and headaches.  Endo/Heme/Allergies:  Does not bruise/bleed easily.  Psychiatric/Behavioral:  Negative for depression. The patient is not nervous/anxious and does not have insomnia.     Allergies  Allergen Reactions   Nickel Itching    Past Medical History:  Diagnosis Date   Abscess of knee    Obesity     Past Surgical History:  Procedure Laterality Date   WISDOM TOOTH EXTRACTION      Social History   Socioeconomic History   Marital status: Married    Spouse name: Not on file   Number of  children: 1   Years of education: Not on file   Highest education level: Not on file  Occupational History   Not on file  Tobacco Use   Smoking status: Never   Smokeless tobacco: Never  Vaping Use   Vaping status: Never Used  Substance and Sexual Activity   Alcohol use: Yes    Comment: OCCASSION'S   Drug use: No   Sexual activity: Not Currently    Birth control/protection: None  Other Topics Concern   Not on file  Social History Narrative   Not on file   Social Drivers of Health   Financial Resource Strain: Low Risk  (02/22/2024)   Overall Financial Resource Strain (CARDIA)    Difficulty of Paying Living Expenses: Not hard at all  Food Insecurity: No Food Insecurity (02/22/2024)   Hunger Vital Sign    Worried About Running Out of Food in the Last Year: Never true    Ran Out of Food in the Last Year: Never true  Transportation Needs: Unknown (02/22/2024)   PRAPARE - Administrator, Civil Service (Medical): No    Lack of Transportation (Non-Medical): Not on file  Physical Activity: Sufficiently Active (02/22/2024)   Exercise Vital Sign    Days of Exercise per Week: 5 days    Minutes of Exercise per Session: 30 min  Stress: No Stress Concern Present (02/22/2024)   [employer] of Occupational Health - Occupational Stress Questionnaire    Feeling of Stress: Only a little  Social Connections: Socially Isolated (02/22/2024)   Social Connection and Isolation Panel    Frequency of Communication with Friends and Family: Once a week    Frequency of Social Gatherings with Friends and Family: Once a week    Attends Religious Services: Never    Database Administrator or Organizations: No    Attends Engineer, Structural: Never    Marital Status: Married    Family History  Problem Relation Age of Onset   Gallstones Mother    Colon polyps Maternal Grandmother    Edema Paternal Grandmother    Pancreatic cancer Other     Health Maintenance  Topic  Date Due   HIV Screening  Never done   Hepatitis C Screening  Never done   Influenza Vaccine  07/12/2024 (Originally 11/13/2023)   Hepatitis B Vaccines 19-59 Average Risk (1 of 3 - 19+ 3-dose series) 02/21/2025 (Originally 09/04/2014)   HPV VACCINES (1 - 3-dose SCDM series) 02/21/2025 (Originally 09/04/2022)   COVID-19 Vaccine (4 - 2025-26 season) 03/09/2025 (Originally 12/14/2023)   DTaP/Tdap/Td (2 - Td or Tdap) 07/03/2031   Pneumococcal Vaccine  Aged Out   Meningococcal B Vaccine  Aged Out     ----------------------------------------------------------------------------------------------------------------------------------------------------------------------------------------------------------------- Physical Exam BP 113/72 (BP Location: Left Arm, Patient Position: Sitting, Cuff Size: Large)   Pulse 75   Ht 6' 2 (1.88 m)   Wt (!) 301 lb (136.5 kg)   SpO2 97%   BMI 38.65 kg/m   Physical Exam Constitutional:      General: He is not in acute distress. HENT:     Head: Normocephalic and atraumatic.     Right Ear: Tympanic membrane and external ear normal.     Left Ear: Tympanic membrane and external ear normal.  Eyes:     General: No scleral icterus. Neck:     Thyroid: No thyromegaly.  Cardiovascular:     Rate and Rhythm: Normal rate and regular rhythm.     Heart sounds: Normal heart sounds.  Pulmonary:     Effort: Pulmonary effort is normal.     Breath sounds: Normal breath sounds.  Abdominal:     General: Bowel sounds are normal. There is no distension.     Palpations: Abdomen is soft.     Tenderness: There is no abdominal tenderness. There is no guarding.  Musculoskeletal:     Cervical back: Normal range of motion.  Lymphadenopathy:     Cervical: No cervical adenopathy.  Skin:    General: Skin is warm and dry.     Findings: No rash.  Neurological:     Mental Status: He is alert and oriented to person, place, and time.     Cranial Nerves: No cranial nerve deficit.      Motor: No abnormal muscle tone.  Psychiatric:        Mood and Affect: Mood normal.        Behavior: Behavior normal.     ------------------------------------------------------------------------------------------------------------------------------------------------------------------------------------------------------------------- Assessment and Plan  Well adult exam Well adult Orders Placed This Encounter  Procedures   CMP14+EGFR   CBC with Differential/Platelet   Lipid Panel With LDL/HDL Ratio   HgB A1c   TSH   B12   Ferritin   Sed Rate (ESR)   C-reactive protein    Immunizations: UTD.  Screenings: Up-to-date Anticipatory guidance/risk factor reduction: Recommendations per HPI  Daytime somnolence Not currently using CPAP due to intolerance.  Recommend zepbound .  Will try to get this approved for him.    Meds ordered this encounter  Medications   tirzepatide  (ZEPBOUND ) 2.5 MG/0.5ML Pen    Sig: Inject 2.5 mg into the skin once a week.    Dispense:  2 mL    Refill:  0   tirzepatide  (ZEPBOUND ) 5 MG/0.5ML Pen    Sig: Inject 5 mg into the skin once a week.    Dispense:  2 mL    Refill:  0    No follow-ups on file.

## 2024-02-23 LAB — TSH: TSH: 2.82 u[IU]/mL (ref 0.450–4.500)

## 2024-02-23 LAB — CBC WITH DIFFERENTIAL/PLATELET
Basophils Absolute: 0.1 x10E3/uL (ref 0.0–0.2)
Basos: 1 %
EOS (ABSOLUTE): 0.1 x10E3/uL (ref 0.0–0.4)
Eos: 2 %
Hematocrit: 46 % (ref 37.5–51.0)
Hemoglobin: 15.2 g/dL (ref 13.0–17.7)
Immature Grans (Abs): 0 x10E3/uL (ref 0.0–0.1)
Immature Granulocytes: 0 %
Lymphocytes Absolute: 1.9 x10E3/uL (ref 0.7–3.1)
Lymphs: 23 %
MCH: 28.8 pg (ref 26.6–33.0)
MCHC: 33 g/dL (ref 31.5–35.7)
MCV: 87 fL (ref 79–97)
Monocytes Absolute: 0.6 x10E3/uL (ref 0.1–0.9)
Monocytes: 8 %
Neutrophils Absolute: 5.4 x10E3/uL (ref 1.4–7.0)
Neutrophils: 66 %
Platelets: 248 x10E3/uL (ref 150–450)
RBC: 5.28 x10E6/uL (ref 4.14–5.80)
RDW: 12.8 % (ref 11.6–15.4)
WBC: 8.2 x10E3/uL (ref 3.4–10.8)

## 2024-02-23 LAB — LIPID PANEL WITH LDL/HDL RATIO
Cholesterol, Total: 144 mg/dL (ref 100–199)
HDL: 34 mg/dL — ABNORMAL LOW (ref >39)
LDL Chol Calc (NIH): 93 mg/dL (ref 0–99)
LDL/HDL Ratio: 2.7 ratio (ref 0.0–3.6)
Triglycerides: 88 mg/dL (ref 0–149)
VLDL Cholesterol Cal: 17 mg/dL (ref 5–40)

## 2024-02-23 LAB — CMP14+EGFR
ALT: 25 IU/L (ref 0–44)
AST: 16 IU/L (ref 0–40)
Albumin: 4.6 g/dL (ref 4.3–5.2)
Alkaline Phosphatase: 75 IU/L (ref 47–123)
BUN/Creatinine Ratio: 12 (ref 9–20)
BUN: 12 mg/dL (ref 6–20)
Bilirubin Total: 0.4 mg/dL (ref 0.0–1.2)
CO2: 25 mmol/L (ref 20–29)
Calcium: 9.5 mg/dL (ref 8.7–10.2)
Chloride: 100 mmol/L (ref 96–106)
Creatinine, Ser: 1.01 mg/dL (ref 0.76–1.27)
Globulin, Total: 2.4 g/dL (ref 1.5–4.5)
Glucose: 89 mg/dL (ref 70–99)
Potassium: 4.3 mmol/L (ref 3.5–5.2)
Sodium: 137 mmol/L (ref 134–144)
Total Protein: 7 g/dL (ref 6.0–8.5)
eGFR: 104 mL/min/1.73 (ref >59)

## 2024-02-23 LAB — C-REACTIVE PROTEIN: CRP: 8 mg/L (ref 0–10)

## 2024-02-23 LAB — SEDIMENTATION RATE: Sed Rate: 18 mm/h — ABNORMAL HIGH (ref 0–15)

## 2024-02-23 LAB — HEMOGLOBIN A1C
Est. average glucose Bld gHb Est-mCnc: 108 mg/dL
Hgb A1c MFr Bld: 5.4 % (ref 4.8–5.6)

## 2024-02-23 LAB — FERRITIN: Ferritin: 167 ng/mL (ref 30–400)

## 2024-02-23 LAB — VITAMIN B12: Vitamin B-12: 513 pg/mL (ref 232–1245)

## 2024-02-29 ENCOUNTER — Encounter: Payer: Self-pay | Admitting: Family Medicine

## 2024-04-21 ENCOUNTER — Encounter: Payer: Self-pay | Admitting: Family Medicine

## 2024-04-26 MED ORDER — WEGOVY 4 MG PO TABS: 4.0000 mg | ORAL_TABLET | Freq: Every day | ORAL | 0 refills | Status: AC

## 2024-04-26 MED ORDER — WEGOVY 1.5 MG PO TABS: 1.5000 mg | ORAL_TABLET | Freq: Every day | ORAL | 0 refills | Status: AC

## 2024-04-26 MED ORDER — WEGOVY 9 MG PO TABS: 9.0000 mg | ORAL_TABLET | Freq: Every day | ORAL | 0 refills | Status: AC

## 2024-05-10 ENCOUNTER — Telehealth: Payer: Self-pay | Admitting: Pharmacy Technician

## 2024-05-10 ENCOUNTER — Other Ambulatory Visit (HOSPITAL_COMMUNITY): Payer: Self-pay

## 2024-05-10 NOTE — Telephone Encounter (Signed)
 Pharmacy Patient Advocate Encounter   Received notification from [facility] that prior authorization for Wegovy  1.5 MG tablet is required/requested.   Insurance verification completed.   The patient is insured through [facility]   Per test claim: Per test claim, medication is not covered due to plan/benefit exclusion, PA not submitted at this time

## 2024-05-10 NOTE — Telephone Encounter (Signed)
 Last read by Massie Schiller at 11:34AM on 05/10/2024.
